# Patient Record
Sex: Male | Born: 1970
Health system: Southern US, Community
[De-identification: ages and names within clinical notes are randomized; demographics above are authoritative.]

## PROBLEM LIST (undated history)

## (undated) DIAGNOSIS — E119 Type 2 diabetes mellitus without complications: Secondary | ICD-10-CM

## (undated) HISTORY — PX: CERVICAL FUSION: SHX112

## (undated) HISTORY — PX: APPENDECTOMY: SHX54

## (undated) HISTORY — PX: EYE SURGERY: SHX253

## (undated) HISTORY — DX: Type 2 diabetes mellitus without complications: E11.9

---

## 2004-03-25 ENCOUNTER — Emergency Department: Payer: Self-pay | Admitting: Internal Medicine

## 2004-10-28 ENCOUNTER — Inpatient Hospital Stay: Payer: Self-pay | Admitting: Surgery

## 2005-01-11 ENCOUNTER — Emergency Department: Payer: Self-pay | Admitting: Emergency Medicine

## 2005-04-19 ENCOUNTER — Emergency Department: Payer: Self-pay | Admitting: Emergency Medicine

## 2006-01-01 ENCOUNTER — Emergency Department: Payer: Self-pay | Admitting: Emergency Medicine

## 2006-03-12 ENCOUNTER — Emergency Department: Payer: Self-pay | Admitting: Emergency Medicine

## 2006-06-24 ENCOUNTER — Emergency Department: Payer: Self-pay | Admitting: Emergency Medicine

## 2008-09-30 ENCOUNTER — Emergency Department: Payer: Self-pay | Admitting: Emergency Medicine

## 2009-11-11 ENCOUNTER — Emergency Department: Payer: Self-pay | Admitting: Unknown Physician Specialty

## 2010-09-30 ENCOUNTER — Emergency Department: Payer: Self-pay | Admitting: Emergency Medicine

## 2012-08-21 ENCOUNTER — Ambulatory Visit: Payer: Self-pay | Admitting: Family Medicine

## 2012-08-24 ENCOUNTER — Ambulatory Visit: Payer: Self-pay | Admitting: Emergency Medicine

## 2012-08-29 ENCOUNTER — Ambulatory Visit: Payer: Self-pay

## 2012-09-04 ENCOUNTER — Encounter: Payer: Self-pay | Admitting: Emergency Medicine

## 2012-09-04 ENCOUNTER — Ambulatory Visit: Payer: Self-pay | Admitting: Family Medicine

## 2012-09-10 ENCOUNTER — Encounter: Payer: Self-pay | Admitting: Emergency Medicine

## 2012-09-12 ENCOUNTER — Ambulatory Visit: Payer: Self-pay

## 2012-10-02 ENCOUNTER — Ambulatory Visit: Payer: Self-pay | Admitting: Family Medicine

## 2012-10-10 ENCOUNTER — Encounter: Payer: Self-pay | Admitting: Emergency Medicine

## 2012-11-10 ENCOUNTER — Encounter: Payer: Self-pay | Admitting: Emergency Medicine

## 2012-12-10 ENCOUNTER — Encounter: Payer: Self-pay | Admitting: Emergency Medicine

## 2013-10-30 ENCOUNTER — Emergency Department: Payer: Self-pay | Admitting: Emergency Medicine

## 2014-01-20 ENCOUNTER — Emergency Department: Payer: Self-pay | Admitting: Internal Medicine

## 2014-03-22 ENCOUNTER — Emergency Department: Payer: Self-pay | Admitting: Emergency Medicine

## 2014-03-22 LAB — BASIC METABOLIC PANEL
Anion Gap: 6 — ABNORMAL LOW (ref 7–16)
BUN: 15 mg/dL (ref 7–18)
CREATININE: 1.18 mg/dL (ref 0.60–1.30)
Calcium, Total: 8.4 mg/dL — ABNORMAL LOW (ref 8.5–10.1)
Chloride: 109 mmol/L — ABNORMAL HIGH (ref 98–107)
Co2: 26 mmol/L (ref 21–32)
EGFR (African American): >60
EGFR (Non-African Amer.): >60
GLUCOSE: 80 mg/dL (ref 65–99)
Osmolality: 281 (ref 275–301)
Potassium: 3.6 mmol/L (ref 3.5–5.1)
Sodium: 141 mmol/L (ref 136–145)

## 2014-03-22 LAB — CBC
HCT: 44.5 % (ref 40.0–52.0)
HGB: 15 g/dL (ref 13.0–18.0)
MCH: 29 pg (ref 26.0–34.0)
MCHC: 33.6 g/dL (ref 32.0–36.0)
MCV: 86 fL (ref 80–100)
PLATELETS: 134 10*3/uL — AB (ref 150–440)
RBC: 5.16 10*6/uL (ref 4.40–5.90)
RDW: 13.2 % (ref 11.5–14.5)
WBC: 11.8 10*3/uL — ABNORMAL HIGH (ref 3.8–10.6)

## 2016-02-06 ENCOUNTER — Ambulatory Visit
Admission: EM | Admit: 2016-02-06 | Discharge: 2016-02-06 | Disposition: A | Discharge status: Home / Self Care | Payer: Medicaid Other | Attending: Family Medicine | Admitting: Family Medicine

## 2016-02-06 ENCOUNTER — Encounter: Payer: Self-pay | Admitting: Emergency Medicine

## 2016-02-06 ENCOUNTER — Emergency Department
Admission: EM | Admit: 2016-02-06 | Discharge: 2016-02-06 | Disposition: A | Discharge status: Home / Self Care | Payer: Medicaid Other | Attending: Emergency Medicine | Admitting: Emergency Medicine

## 2016-02-06 DIAGNOSIS — Y999 Unspecified external cause status: Secondary | ICD-10-CM | POA: Diagnosis not present

## 2016-02-06 DIAGNOSIS — T161XXA Foreign body in right ear, initial encounter: Secondary | ICD-10-CM | POA: Insufficient documentation

## 2016-02-06 DIAGNOSIS — Y939 Activity, unspecified: Secondary | ICD-10-CM | POA: Insufficient documentation

## 2016-02-06 DIAGNOSIS — F17210 Nicotine dependence, cigarettes, uncomplicated: Secondary | ICD-10-CM | POA: Insufficient documentation

## 2016-02-06 DIAGNOSIS — Y929 Unspecified place or not applicable: Secondary | ICD-10-CM | POA: Insufficient documentation

## 2016-02-06 DIAGNOSIS — X58XXXA Exposure to other specified factors, initial encounter: Secondary | ICD-10-CM | POA: Diagnosis not present

## 2016-02-06 MED ORDER — KETOROLAC TROMETHAMINE 30 MG/ML IJ SOLN
30.0000 mg | Freq: Once | INTRAMUSCULAR | Status: AC
  Administered 2016-02-06: 30 mg via INTRAMUSCULAR
  Filled 2016-02-06: qty 1

## 2016-02-06 MED ORDER — HYDROCODONE-ACETAMINOPHEN 5-325 MG PO TABS: 1.0000 | ORAL_TABLET | Freq: Four times a day (QID) | ORAL | 0 refills | Status: DC | PRN

## 2016-02-06 NOTE — Discharge Instructions (Addendum)
Please call Dr. Maisie Fus office in the morning to schedule an appointment for follow up.

## 2016-02-06 NOTE — ED Triage Notes (Signed)
Pt was seen at [facility] urgent care. Had moth fly in right ear last night. They are unable to remove it and sent pt here.

## 2016-02-06 NOTE — ED Notes (Signed)
Pt presents with bug in right ear and reports of difficulty with dizziness and difficulty hearing. Blood seen in right ear but bleeding controlled at time of arrival to treatment room.

## 2016-02-06 NOTE — ED Notes (Signed)
Multiple attempts to remove bug were unsuccessful. Pt reports understanding of follow up.

## 2016-02-06 NOTE — ED Triage Notes (Signed)
Patient was letting his dog out last night when a bug flew into his right ear, and its causing pain and hearing loss. He isn't sure if the bug came out or not.

## 2016-02-06 NOTE — ED Provider Notes (Signed)
[facility]    CSN: [id number] Arrival date & time: 02/06/16  1105  First Provider Contact:  First MD Initiated Contact with Patient 02/06/16 1128        History   Chief Complaint Chief Complaint  Patient presents with  . Hearing Problem    Right Ear    HPI Chris Spence. is a 45 y.o. male.   Patient comes in today because of a foreign object in his right ear. He states he was driving last night felt as if something flew in his right ear he thought it was a month. They use peroxide during the night to try to flush the month out his wife used Q-tips to try to get the month out but was unable to visualize. The pain and discomfort is continued cervix come in to be seen and evaluated here. No pertinent past medical history. No previous surgery history and no pertinent family medical history past relevant to today's visit. He does smoke. No known drug allergies.   The history is provided by the patient. No language interpreter was used.  Otalgia  Location:  Right Behind ear:  No abnormality Quality:  Pressure Severity:  Severe Onset quality:  Sudden Duration:  1 day Timing:  Constant Progression:  Worsening Chronicity:  New Context: foreign body   Context comment:  Patient felt that a month flew in his right ear Relieved by:  Nothing Ineffective treatments:  OTC medications Associated symptoms: headaches   Associated symptoms: no abdominal pain, no congestion, no cough, no diarrhea, no ear discharge, no fever, no hearing loss, no neck pain, no rash, no rhinorrhea, no sore throat, no tinnitus and no vomiting   Risk factors: no recent travel, no chronic ear infection and no prior ear surgery     History reviewed. No pertinent past medical history.  There are no active problems to display for this patient.   History reviewed. No pertinent surgical history.     Home Medications    Prior to Admission medications   Not on File    Family  History History reviewed. No pertinent family history.  Social History Social History  Substance Use Topics  . Smoking status: Current Every Day Smoker    Packs/day: 1.00    Types: Cigarettes  . Smokeless tobacco: Never Used  . Alcohol use Yes     Allergies   Review of patient's allergies indicates no known allergies.   Review of Systems Review of Systems  Constitutional: Negative for fever.  HENT: Positive for ear pain. Negative for congestion, ear discharge, hearing loss, rhinorrhea, sore throat and tinnitus.   Respiratory: Negative for cough.   Gastrointestinal: Negative for abdominal pain, diarrhea and vomiting.  Musculoskeletal: Negative for neck pain.  Skin: Negative for rash.  Neurological: Positive for headaches.  All other systems reviewed and are negative.    Physical Exam Triage Vital Signs ED Triage Vitals  Enc Vitals Group     BP 02/06/16 1124 131/83     Pulse Rate 02/06/16 1124 71     Resp 02/06/16 1124 18     Temp 02/06/16 1124 98.6 F (37 C)     Temp Source 02/06/16 1124 Oral     SpO2 02/06/16 1124 98 %     Weight 02/06/16 1123 199 lb (90.3 kg)     Height 02/06/16 1123 5\' 9"  (1.753 m)     Head Circumference --      Peak Flow --  Pain Score 02/06/16 1124 6     Pain Loc --      Pain Edu? --      Excl. in [place]? --    No data found.   Updated Vital Signs BP 131/83 (BP Location: Right Arm)   Pulse 71   Temp 98.6 F (37 C) (Oral)   Resp 18   Ht 5\' 9"  (1.753 m)   Wt 199 lb (90.3 kg)   SpO2 98%   BMI 29.39 kg/m   Visual Acuity Right Eye Distance:   Left Eye Distance:   Bilateral Distance:    Right Eye Near:   Left Eye Near:    Bilateral Near:     Physical Exam  Constitutional: He is oriented to person, place, and time. He appears well-developed and well-nourished. He appears distressed.  HENT:  Head: Normocephalic.  Right Ear: There is swelling and tenderness. A foreign body is present. Tympanic membrane is erythematous.  Left  Ear: Hearing, tympanic membrane, external ear and ear canal normal.  Nose: Nose normal.  Mouth/Throat: Uvula is midline and oropharynx is clear and moist.  Eyes: Pupils are equal, round, and reactive to light.  Neck: Normal range of motion.  Pulmonary/Chest: Effort normal.  Musculoskeletal: Normal range of motion.  Neurological: He is alert and oriented to person, place, and time.  Skin: Skin is warm.  Psychiatric: His mood appears anxious. He is agitated.     UC Treatments / Results  Labs (all labs ordered are listed, but only abnormal results are displayed) Labs Reviewed - No data to display  EKG  EKG Interpretation None       Radiology No results found.  Procedures .Foreign Body Removal Date/Time: 02/06/2016 12:20 PM Performed by: Frederich Cha Authorized by: Frederich Cha  Consent: Verbal consent obtained. Body area: ear Location details: right ear  Sedation: Patient sedated: no Localization method: ENT speculum and magnification Removal mechanism: alligator forceps Complexity: complex Objects recovered: Bits of the moth Post-procedure assessment: residual foreign bodies remain Comments: Patient ear canal was ready irritated and inflamed from the hot June peroxide used earlier. Month was deep in the ear canal irrigations first tried unsuccessfully. Using an alligator forceps attempts were made to try to remove what appeared to be the month but only bits and pieces could be extracted. Due to patient's poor tolerance of that irrigation was tried a second time which was also unsuccessful and at that time no further attempts were made for manual removal and patient will be sent to [facility] ED for further evaluation. Charge nurse Colletta Maryland notified   (including critical care time)  Medications Ordered in UC Medications - No data to display   Initial Impression / Assessment and Plan / UC Course  I have reviewed the triage vital signs and the nursing notes.  Pertinent  labs & imaging results that were available during my care of the patient were reviewed by me and considered in my medical decision making (see chart for details).  Clinical Course    As above. Patient sent to [facility] for further evaluation and treatment of the foreign object suspected be a month in his right ear canal  Final Clinical Impressions(s) / UC Diagnoses   Final diagnoses:  Acute foreign body of ear canal, right, initial encounter    New Prescriptions There are no discharge medications for this patient.    Frederich Cha, MD 02/06/16 [phone]

## 2016-02-06 NOTE — ED Provider Notes (Signed)
[facility] Emergency Department Provider Note  ____________________________________________  Time seen: Approximately 1:31 PM  I have reviewed the triage vital signs and the nursing notes.   HISTORY  Chief Complaint Foreign Body in Ear    HPI Chris Spence. is a 45 y.o. male , NAD, presents to the emergency department with one-day history of insect in the right ear canal. Patient states a moth flew into his ear last night. The family member attempted to remove some off with out success last evening. He presented to the [facility] in urgent care today again to get the insect removed. Attempts were made at the urgent care but were unable to remove the insect therefore they referred him here. Patient has had increased pain about the right ear canal along with bleeding since the earlier attempt was completed to remove the insect. Notes that his hearing is muffled. Has not had any fevers, chills, body aches. Denies any dizziness, neck pain, headaches. Has had no chest pain or shortness of breath. Has not taken anything at this time for pain.   History reviewed. No pertinent past medical history.  There are no active problems to display for this patient.   History reviewed. No pertinent surgical history.  Prior to Admission medications   Medication Sig Start Date End Date Taking? Authorizing Provider  HYDROcodone-acetaminophen (NORCO) 5-325 MG tablet Take 1 tablet by mouth every 6 (six) hours as needed for severe pain. 02/06/16   Jami L Hagler, PA-C    Allergies Review of patient's allergies indicates no known allergies.  History reviewed. No pertinent family history.  Social History Social History  Substance Use Topics  . Smoking status: Current Every Day Smoker    Packs/day: 1.00    Types: Cigarettes  . Smokeless tobacco: Never Used  . Alcohol use Yes     Review of Systems  Constitutional: No fever/chills ENT: Insect in right ear canal causing  decreased hearing. Pain about the right ear canal with some bleeding. Cardiovascular: No chest pain. Respiratory: No shortness of breath.  Musculoskeletal: Negative for neck pain.  Skin: Negative for rash, redness, swelling. Neurological: Negative for headaches, focal weakness or numbness. No dizziness. 10-point ROS otherwise negative.  ____________________________________________   PHYSICAL EXAM:  VITAL SIGNS: ED Triage Vitals  Enc Vitals Group     BP 02/06/16 1246 (!) 136/91     Pulse Rate 02/06/16 1246 76     Resp 02/06/16 1246 16     Temp 02/06/16 1246 98 F (36.7 C)     Temp Source 02/06/16 1246 Oral     SpO2 02/06/16 1246 98 %     Weight 02/06/16 1244 199 lb (90.3 kg)     Height 02/06/16 1244 5\' 9"  (1.753 m)     Head Circumference --      Peak Flow --      Pain Score --      Pain Loc --      Pain Edu? --      Excl. in [place]? --      Constitutional: Alert and oriented. Well appearing and in no acute distress. Eyes: Conjunctivae are normal without icterus or injection. Head: Atraumatic. ENT:      Ears: Right canal with black foreign body deep in the canal near the eardrum. Some lacerations or bleeding noted throughout the ear canal from previous attempts to remove foreign body. Canal is significantly tender due to the previous attempts to remove the insect.  Nose: No congestion/rhinnorhea.      Mouth/Throat: Mucous membranes are moist.  Neck: Supple with full range of motion. Hematological/Lymphatic/Immunilogical: No cervical lymphadenopathy. Cardiovascular: Good peripheral circulation. Respiratory: Normal respiratory effort without tachypnea or retractions.  Neurologic:  Normal speech and language. No gross focal neurologic deficits are appreciated.  Skin:  Skin is warm, dry and intact. No rash noted. Psychiatric: Mood and affect are normal. Speech and behavior are normal. Patient exhibits appropriate insight and  judgement.   ____________________________________________   LABS  None ____________________________________________  EKG  None ____________________________________________  RADIOLOGY  None ____________________________________________    PROCEDURES  Procedure(s) performed: None   Procedures   Medications  ketorolac (TORADOL) 30 MG/ML injection 30 mg (30 mg Intramuscular Given 02/06/16 1352)     ____________________________________________   INITIAL IMPRESSION / ASSESSMENT AND PLAN / ED COURSE  Pertinent labs & imaging results that were available during my care of the patient were reviewed by me and considered in my medical decision making (see chart for details).  Clinical Course  Comment By Time  Patient states that Toradol did not help with pain and requesting pain medication use at home tonight. Will have him two tablets of Norco to assist with pain control this evening. Braxton Feathers, PA-C 08/27 1420    Patient's diagnosis is consistent with foreign body of right ear canal. Patient given Toradol for pain while in the emergency department and tolerated well without side effects. Patient will be discharged home with a prescription for Norco to take as needed for severe pain as well as instructions to take OTC Tylenol or Motrin as needed. Do not make any further attempts to retrieve insect. Patient is to follow up with Dr. Kathyrn Sheriff in ENT tomorrow for further evaluation and treatment. Patient is given ED precautions to return to the ED for any worsening or new symptoms.    ____________________________________________  FINAL CLINICAL IMPRESSION(S) / ED DIAGNOSES  Final diagnoses:  Acute foreign body of right ear canal, initial encounter      NEW MEDICATIONS STARTED DURING THIS VISIT:  New Prescriptions   HYDROCODONE-ACETAMINOPHEN (NORCO) 5-325 MG TABLET    Take 1 tablet by mouth every 6 (six) hours as needed for severe pain.         Braxton Feathers,  PA-C 02/06/16 [place], MD 02/06/16 1520

## 2016-05-08 ENCOUNTER — Ambulatory Visit
Admission: EM | Admit: 2016-05-08 | Discharge: 2016-05-08 | Disposition: A | Discharge status: Home / Self Care | Payer: Medicaid Other | Attending: Family Medicine | Admitting: Family Medicine

## 2016-05-08 DIAGNOSIS — M62830 Muscle spasm of back: Secondary | ICD-10-CM

## 2016-05-08 DIAGNOSIS — H1031 Unspecified acute conjunctivitis, right eye: Secondary | ICD-10-CM

## 2016-05-08 DIAGNOSIS — M549 Dorsalgia, unspecified: Secondary | ICD-10-CM

## 2016-05-08 MED ORDER — TIZANIDINE HCL 4 MG PO CAPS: 4.0000 mg | ORAL_CAPSULE | Freq: Three times a day (TID) | ORAL | 0 refills | Status: DC | PRN

## 2016-05-08 MED ORDER — MELOXICAM 15 MG PO TABS: 15.0000 mg | ORAL_TABLET | Freq: Every day | ORAL | 0 refills | Status: DC

## 2016-05-08 MED ORDER — GENTAMICIN SULFATE 0.3 % OP SOLN: 2.0000 [drp] | Freq: Three times a day (TID) | OPHTHALMIC | 0 refills | Status: DC

## 2016-05-08 MED ORDER — HYDROCODONE-ACETAMINOPHEN 5-325 MG PO TABS: 1.0000 | ORAL_TABLET | Freq: Three times a day (TID) | ORAL | 0 refills | Status: DC | PRN

## 2016-05-08 MED ORDER — ORPHENADRINE CITRATE ER 100 MG PO TB12: 100.0000 mg | ORAL_TABLET | Freq: Two times a day (BID) | ORAL | 0 refills | Status: DC

## 2016-05-08 NOTE — ED Provider Notes (Signed)
[facility]    CSN: [id number] Arrival date & time: 05/08/16  1416     History   Chief Complaint Chief Complaint  Patient presents with  . Conjunctivitis    Right  . Back Pain    HPI Chris Guanzon. is a 45 y.o. male.   Patient is a 45 year old white male with multiple complaints.   Problem #1 right conjunctivitis. He went to work last night before admission to have a by his right eye being red and inflamed. This morning the eye was matted shut and took a while to get by open. No known drug allergies no foreign object in the eye but he does states it burns.  #2 back pain he has had cervical disc disease requiring surgery and fusion 4. He states his birthday was Saturday and not create out done by his son he try to carry more will that he probably should have. Reports Sunday pain in his left back and the pain has continued and gotten worse. Nothing seems to help.  #3 history of foreign object in right ear month. He shows me pictures of the month it was finally removed by ENT after ER was unable to get the month out.   The history is provided by the patient. No language interpreter was used.  Conjunctivitis  This is a new problem. The current episode started yesterday. The problem occurs constantly. The problem has not changed since onset.Pertinent negatives include no chest pain, no abdominal pain, no headaches and no shortness of breath. Nothing aggravates the symptoms. Nothing relieves the symptoms. He has tried nothing for the symptoms. The treatment provided no relief.  Back Pain  Location:  Thoracic spine and lumbar spine Quality:  Aching Radiates to:  Does not radiate Pain severity:  Moderate Onset quality:  Sudden Progression:  Worsening Chronicity:  New Context: recent illness and recent injury   Relieved by:  Nothing Worsened by:  Movement and stress Ineffective treatments:  NSAIDs and bed rest Associated symptoms: no abdominal pain, no chest pain  and no headaches     History reviewed. No pertinent past medical history.  There are no active problems to display for this patient.   History reviewed. No pertinent surgical history.     Home Medications    Prior to Admission medications   Not on File    Family History History reviewed. No pertinent family history.  Social History Social History  Substance Use Topics  . Smoking status: Current Every Day Smoker    Packs/day: 1.00    Types: Cigarettes  . Smokeless tobacco: Never Used  . Alcohol use Yes     Allergies   Tramadol   Review of Systems Review of Systems  Eyes: Positive for pain, discharge, redness and itching.  Respiratory: Negative for shortness of breath.   Cardiovascular: Negative for chest pain.  Gastrointestinal: Negative for abdominal pain.  Musculoskeletal: Positive for back pain.  Neurological: Negative for headaches.  All other systems reviewed and are negative.    Physical Exam Triage Vital Signs ED Triage Vitals  Enc Vitals Group     BP 05/08/16 1534 (!) 147/87     Pulse Rate 05/08/16 1534 87     Resp 05/08/16 1534 18     Temp 05/08/16 1534 98 F (36.7 C)     Temp Source 05/08/16 1534 Oral     SpO2 05/08/16 1534 100 %     Weight 05/08/16 1535 198 lb (89.8 kg)  Height 05/08/16 1535 5\' 10"  (1.778 m)     Head Circumference --      Peak Flow --      Pain Score 05/08/16 1536 6     Pain Loc --      Pain Edu? --      Excl. in [place]? --    No data found.   Updated Vital Signs BP (!) 147/87 (BP Location: Left Arm)   Pulse 87   Temp 98 F (36.7 C) (Oral)   Resp 18   Ht 5\' 10"  (1.778 m)   Wt 198 lb (89.8 kg)   SpO2 100%   BMI 28.41 kg/m   Visual Acuity Right Eye Distance:   Left Eye Distance:   Bilateral Distance:    Right Eye Near:   Left Eye Near:    Bilateral Near:     Physical Exam  Constitutional: He is oriented to person, place, and time. He appears well-developed and well-nourished.  HENT:  Head:  Normocephalic and atraumatic.  Eyes: EOM are normal. Pupils are equal, round, and reactive to light. Right conjunctiva is injected. Left conjunctiva is not injected.  Neck: Normal range of motion. Neck supple.  Pulmonary/Chest: Breath sounds normal.  Musculoskeletal: He exhibits tenderness. He exhibits no deformity.       Lumbar back: He exhibits decreased range of motion, tenderness, swelling and spasm.       Back:  Neurological: He is alert and oriented to person, place, and time.  Skin: Skin is warm.  Psychiatric: He has a normal mood and affect.  Vitals reviewed.    UC Treatments / Results  Labs (all labs ordered are listed, but only abnormal results are displayed) Labs Reviewed - No data to display  EKG  EKG Interpretation None       Radiology No results found.  Procedures Procedures (including critical care time)  Medications Ordered in UC Medications - No data to display   Initial Impression / Assessment and Plan / UC Course  I have reviewed the triage vital signs and the nursing notes.  Pertinent labs & imaging results that were available during my care of the patient were reviewed by me and considered in my medical decision making (see chart for details).  Clinical Course   Patient will retrieve conjunctivitis with gentamicin eyedrops 2 drops in the right eye 3 times a day. For the back pain without place him on Norflex 100 mg twice a day pharmacy called to let me know that he is on Medicaid that was switched at his to his discharge to Zanaflex instead 4 mg 3 times a day and Mobic 15 mg 1 tablet daily. Patient was also given prescription for Vicodin since he is allergic to tramadol reporting shows the last year and a half in October he was given 2 Vicodin tablets and he was also given prescription of tramadol apparently in September which is where he had the problem with tramadol. Follow-up PCP as needed and work note given to him as well for today and  tomorrow.  Final Clinical Impressions(s) / UC Diagnoses   Final diagnoses:  None    New Prescriptions New Prescriptions   No medications on file     Frederich Cha, MD 05/08/16 1725

## 2016-05-08 NOTE — ED Triage Notes (Signed)
Pt c/o pink eye in his right and drainage. He also pulled his back at a bonfire lifting a large log. The left side of the back.

## 2017-12-28 ENCOUNTER — Ambulatory Visit (INDEPENDENT_AMBULATORY_CARE_PROVIDER_SITE_OTHER): Payer: BLUE CROSS/BLUE SHIELD | Admitting: Family Medicine

## 2017-12-28 ENCOUNTER — Encounter: Payer: Self-pay | Admitting: Family Medicine

## 2017-12-28 VITALS — BP 112/72 | HR 80 | Ht 69.0 in | Wt 194.0 lb

## 2017-12-28 DIAGNOSIS — Z7689 Persons encountering health services in other specified circumstances: Secondary | ICD-10-CM

## 2017-12-28 DIAGNOSIS — D171 Benign lipomatous neoplasm of skin and subcutaneous tissue of trunk: Secondary | ICD-10-CM | POA: Diagnosis not present

## 2017-12-28 DIAGNOSIS — B360 Pityriasis versicolor: Secondary | ICD-10-CM

## 2017-12-28 DIAGNOSIS — L723 Sebaceous cyst: Secondary | ICD-10-CM

## 2017-12-28 NOTE — Patient Instructions (Signed)
Tinea Versicolor Tinea versicolor is a common fungal infection of the skin. It causes a rash that appears as light or dark patches on the skin. The rash most often occurs on the chest, back, neck, or upper arms. This condition is more common during warm weather. Other than affecting how your skin looks, tinea versicolor usually does not cause other problems. In most cases, the infection goes away in a few weeks with treatment. It may take a few months for the patches on your skin to clear up. What are the causes? Tinea versicolor occurs when a type of fungus that is normally present on the skin starts to overgrow. This fungus is a kind of yeast. The exact cause of the overgrowth is not known. This condition cannot be passed from one person to another (noncontagious). What increases the risk? This condition is more likely to develop when certain factors are present, such as:  Heat and humidity.  Sweating too much.  Hormone changes.  Oily skin.  A weak defense (immune) system.  What are the signs or symptoms? Symptoms of this condition may include:  A rash on your skin that is made up of light or dark patches. The rash may have: ? Patches of tan or pink spots on light skin. ? Patches of white or brown spots on dark skin. ? Patches of skin that do not tan. ? Well-marked edges. ? Scales on the discolored areas.  Mild itching.  How is this diagnosed? A health care provider can usually diagnose this condition by looking at your skin. During the exam, he or she may use ultraviolet light to help determine the extent of the infection. In some cases, a skin sample may be taken by scraping the rash. This sample will be viewed under a microscope to check for yeast overgrowth. How is this treated? Treatment for this condition may include:  Dandruff shampoo that is applied to the affected skin during showers or bathing.  Over-the-counter medicated skin cream, lotion, or soaps.  Prescription  antifungal medicine in the form of skin cream or pills.  Medicine to help reduce itching.  Follow these instructions at home:  Take medicines only as directed by your health care provider.  Apply dandruff shampoo to the affected area if told to do so by your health care provider. You may be instructed to scrub the affected skin for several minutes each day.  Do not scratch the affected area of skin.  Avoid hot and humid conditions.  Do not use tanning booths.  Try to avoid sweating a lot. Contact a health care provider if:  Your symptoms get worse.  You have a fever.  You have redness, swelling, or pain at the site of your rash.  You have fluid, blood, or pus coming from your rash.  Your rash returns after treatment. This information is not intended to replace advice given to you by your health care provider. Make sure you discuss any questions you have with your health care provider. Document Released: 05/26/2000 Document Revised: 01/30/2016 Document Reviewed: 03/10/2014 Elsevier Interactive Patient Education  2018 [employer]  

## 2017-12-28 NOTE — Progress Notes (Signed)
Name: Chris Spence.   MRN: [id number]    DOB: Nov 07, 1970   Date:12/28/2017       Progress Note  Subjective  Chief Complaint  Chief Complaint  Patient presents with  . Establish Care  . Cyst    "have a lump on back and Left armpit"- the one on back- been there for years. The armpit- been there fore almost a year/ gradually getting larger, tender and harder    Patient presents to establish care with primary care physicians. Patient with difficulty sleeping on back due to pain developing in area of lipoma.  Rash  This is a chronic problem. The current episode started more than 1 year ago. The problem has been waxing and waning since onset. The rash is diffuse. Pertinent negatives include no cough, diarrhea, fever, joint pain, shortness of breath or sore throat.    No problem-specific Assessment & Plan notes found for this encounter.   History reviewed. No pertinent past medical history.  Past Surgical History:  Procedure Laterality Date  . APPENDECTOMY    . CERVICAL FUSION     C-6 and C-7  . EYE SURGERY     R) eye- "growth removed"  L) eye- removed foreign object    Family History  Problem Relation Age of Onset  . Cancer Mother   . Cancer Sister   . Diabetes Maternal Grandmother     Social History   Socioeconomic History  . Marital status: Married    Spouse name: Not on file  . Number of children: Not on file  . Years of education: Not on file  . Highest education level: Not on file  Occupational History  . Not on file  Social Needs  . Financial resource strain: Not on file  . Food insecurity:    Worry: Not on file    Inability: Not on file  . Transportation needs:    Medical: Not on file    Non-medical: Not on file  Tobacco Use  . Smoking status: Current Every Day Smoker    Packs/day: 1.00    Types: Cigarettes  . Smokeless tobacco: Never Used  . Tobacco comment: patches and pills discussed  Substance and Sexual Activity  . Alcohol use: Yes  . Drug  use: No  . Sexual activity: Yes  Lifestyle  . Physical activity:    Days per week: Not on file    Minutes per session: Not on file  . Stress: Not on file  Relationships  . Social connections:    Talks on phone: Not on file    Gets together: Not on file    Attends religious service: Not on file    Active member of club or organization: Not on file    Attends meetings of clubs or organizations: Not on file    Relationship status: Not on file  . Intimate partner violence:    Fear of current or ex partner: Not on file    Emotionally abused: Not on file    Physically abused: Not on file    Forced sexual activity: Not on file  Other Topics Concern  . Not on file  Social History Narrative  . Not on file    Allergies  Allergen Reactions  . Tramadol Nausea And Vomiting    Outpatient Medications Prior to Visit  Medication Sig Dispense Refill  . Multiple Vitamins-Minerals (ONE-A-DAY MENS HEALTH FORMULA PO) Take 1 tablet by mouth daily.    Marland Kitchen gentamicin (GARAMYCIN) 0.3 % ophthalmic  solution Place 2 drops into the right eye 3 (three) times daily. For 3-5 days 5 mL 0  . HYDROcodone-acetaminophen (NORCO) 5-325 MG tablet Take 1 tablet by mouth every 8 (eight) hours as needed for moderate pain. 15 tablet 0  . meloxicam (MOBIC) 15 MG tablet Take 1 tablet (15 mg total) by mouth daily. 30 tablet 0  . tiZANidine (ZANAFLEX) 4 MG capsule Take 1 capsule (4 mg total) by mouth 3 (three) times daily as needed for muscle spasms. 60 capsule 0   No facility-administered medications prior to visit.     Review of Systems  Constitutional: Negative for chills, fever, malaise/fatigue and weight loss.  HENT: Negative for ear discharge, ear pain and sore throat.   Eyes: Negative for blurred vision.  Respiratory: Negative for cough, sputum production, shortness of breath and wheezing.   Cardiovascular: Negative for chest pain, palpitations and leg swelling.  Gastrointestinal: Negative for abdominal pain,  blood in stool, constipation, diarrhea, heartburn, melena and nausea.  Genitourinary: Negative for dysuria, frequency, hematuria and urgency.  Musculoskeletal: Negative for back pain, joint pain, myalgias and neck pain.  Skin: Positive for rash.  Neurological: Negative for dizziness, tingling, sensory change, focal weakness and headaches.  Endo/Heme/Allergies: Negative for environmental allergies and polydipsia. Does not bruise/bleed easily.  Psychiatric/Behavioral: Negative for depression and suicidal ideas. The patient is not nervous/anxious and does not have insomnia.      Objective  Vitals:   12/28/17 1429  BP: 112/72  Pulse: 80  Weight: 194 lb (88 kg)  Height: 5\' 9"  (1.753 m)    Physical Exam  Constitutional: He is oriented to person, place, and time.  HENT:  Head: Normocephalic.  Right Ear: External ear normal.  Left Ear: External ear normal.  Nose: Nose normal.  Mouth/Throat: Oropharynx is clear and moist.  Eyes: Pupils are equal, round, and reactive to light. Conjunctivae and EOM are normal. Right eye exhibits no discharge. Left eye exhibits no discharge. No scleral icterus.  Neck: Normal range of motion. Neck supple. No JVD present. No tracheal deviation present. No thyromegaly present.  Cardiovascular: Normal rate, regular rhythm, normal heart sounds and intact distal pulses. Exam reveals no gallop and no friction rub.  No murmur heard. Pulmonary/Chest: Breath sounds normal. No respiratory distress. He has no wheezes. He has no rales.  Abdominal: Soft. Bowel sounds are normal. He exhibits no mass. There is no hepatosplenomegaly. There is no tenderness. There is no rebound, no guarding and no CVA tenderness.  Musculoskeletal: Normal range of motion. He exhibits no edema or tenderness.  Lymphadenopathy:    He has no cervical adenopathy.  Neurological: He is alert and oriented to person, place, and time. He has normal strength and normal reflexes.  Skin: Skin is warm. No  rash noted.  1)Sebaceous cyst left axillary /no tenderness/erythema 3x2 cm firm  2) palpable lipom right suprascapular area   Nursing note and vitals reviewed.     Assessment & Plan  Problem List Items Addressed This Visit    None    Visit Diagnoses    Establishing care with new doctor, encounter for    -  Primary   Patient establish for future care.  Needs surgical referral for lipoma and cyst.   Tinea versicolor       chronic recurrent generalized. will trial nizoral shampoo soln.   Lipoma of back       Pain in cervical area while sleep on back in area of lipoma   Relevant Orders  Ambulatory referral to General Surgery   Sebaceous cyst of left axilla       Area gradually enlarging becoming more tender   Relevant Orders   Ambulatory referral to General Surgery      No orders of the defined types were placed in this encounter.     Dr. Mac[facility]  12/28/17

## 2018-01-04 ENCOUNTER — Other Ambulatory Visit: Payer: Self-pay

## 2018-01-07 ENCOUNTER — Ambulatory Visit: Payer: Self-pay | Admitting: Surgery

## 2018-01-09 ENCOUNTER — Ambulatory Visit (INDEPENDENT_AMBULATORY_CARE_PROVIDER_SITE_OTHER): Payer: BLUE CROSS/BLUE SHIELD | Admitting: Surgery

## 2018-01-09 ENCOUNTER — Encounter: Payer: Self-pay | Admitting: Surgery

## 2018-01-09 VITALS — BP 120/88 | HR 77 | Temp 97.5°F | Ht 70.0 in | Wt 197.0 lb

## 2018-01-09 DIAGNOSIS — L729 Follicular cyst of the skin and subcutaneous tissue, unspecified: Secondary | ICD-10-CM | POA: Diagnosis not present

## 2018-01-09 NOTE — Patient Instructions (Signed)
Lipoma A lipoma is a noncancerous (benign) tumor that is made up of fat cells. This is a very common type of soft-tissue growth. Lipomas are usually found under the skin (subcutaneous). They may occur in any tissue of the body that contains fat. Common areas for lipomas to appear include the back, shoulders, buttocks, and thighs. Lipomas grow slowly, and they are usually painless. Most lipomas do not cause problems and do not require treatment. What are the causes? The cause of this condition is not known. What increases the risk? This condition is more likely to develop in:  People who are 40-60 years old.  People who have a family history of lipomas.  What are the signs or symptoms? A lipoma usually appears as a small, round bump under the skin. It may feel soft or rubbery, but the firmness can vary. Most lipomas are not painful. However, a lipoma may become painful if it is located in an area where it pushes on nerves. How is this diagnosed? A lipoma can usually be diagnosed with a physical exam. You may also have tests to confirm the diagnosis and to rule out other conditions. Tests may include:  Imaging tests, such as a CT scan or MRI.  Removal of a tissue sample to be looked at under a microscope (biopsy).  How is this treated? Treatment is not needed for small lipomas that are not causing problems. If a lipoma continues to get bigger or it causes problems, removal is often the best option. Lipomas can also be removed to improve appearance. Removal of a lipoma is usually done with a surgery in which the fatty cells and the surrounding capsule are removed. Most often, a medicine that numbs the area (local anesthetic) is used for this procedure. Follow these instructions at home:  Keep all follow-up visits as directed by your health care provider. This is important. Contact a health care provider if:  Your lipoma becomes larger or hard.  Your lipoma becomes painful, red, or  increasingly swollen. These could be signs of infection or a more serious condition. This information is not intended to replace advice given to you by your health care provider. Make sure you discuss any questions you have with your health care provider. Document Released: 05/19/2002 Document Revised: 11/04/2015 Document Reviewed: 05/25/2014 Elsevier Interactive Patient Education  2018 [employer] Inc[employer]  

## 2018-01-09 NOTE — Progress Notes (Signed)
Patient ID: Chris Danas., male   DOB: April 25, 1971, 47 y.o.   MRN: [id number]  HPI Chris Hurta. is a 47 y.o. male seen at the request of [name] for symptomatic subcutaneous nodules one in the axial and the other one posterior neck.  He reports that he has had those for about 2 years but recently the left axillary nodule has increased in size.  Also reports that it is tender and experiences some intermittent sharp mild to moderate pain.  There is no specific alleviating or aggravating factors.  No weight loss no night chills no B type symptoms. He is able to perform more than 6 METS of activity without any shortness of breath or chest pain.  HPI  No relevant medical history other than a history of melanoma  Past Surgical History:  Procedure Laterality Date  . APPENDECTOMY    . CERVICAL FUSION     C-6 and C-7  . EYE SURGERY     R) eye- "growth removed"  L) eye- removed foreign object    Family History  Problem Relation Age of Onset  . Cancer Mother   . Cancer Sister   . Diabetes Maternal Grandmother     Social History Social History   Tobacco Use  . Smoking status: Current Every Day Smoker    Packs/day: 1.00    Types: Cigarettes  . Smokeless tobacco: Never Used  . Tobacco comment: patches and pills discussed  Substance Use Topics  . Alcohol use: Yes  . Drug use: No    Allergies  Allergen Reactions  . Tramadol Nausea And Vomiting    Current Outpatient Medications  Medication Sig Dispense Refill  . Emollient (EUCERIN) lotion Apply topically.    . Multiple Vitamins-Minerals (ONE-A-DAY MENS HEALTH FORMULA PO) Take 1 tablet by mouth daily.    . Sunscreens (TOTAL BLOCK SPF 60 COVER UP) LOTN Apply topically.     No current facility-administered medications for this visit.      Review of Systems Full ROS  was asked and was negative except for the information on the HPI  Physical Exam Blood pressure 120/88, pulse 77, temperature (!) 97.5 F (36.4 C),  temperature source Oral, height 5\' 10"  (1.778 m), weight 197 lb (89.4 kg). CONSTITUTIONAL: NAD EYES: Pupils are equal, round, and , Sclera are non-icteric. EARS, NOSE, MOUTH AND THROAT: The oropharynx is clear. The oral mucosa is pink and moist. Hearing is intact to voice. LYMPH NODES:  Lymph nodes in the neck are normal. RESPIRATORY:  Lungs are clear. There is normal respiratory effort, with equal breath sounds bilaterally, and without pathologic use of accessory muscles. CARDIOVASCULAR: Heart is regular without murmurs, gallops, or rubs. GI: The abdomen is soft, nontender, and nondistended. There are no palpable masses. There is no hepatosplenomegaly. There are normal bowel sounds in all quadrants. GU: Rectal deferred.   MUSCULOSKELETAL: Normal muscle strength and tone. No cyanosis or edema.   SKIN:  There is a 3 cm subq nodule on left axilla, it is tender. There is a 4 cm sub q nodule posterior neck. [place]. NEUROLOGIC: Motor and sensation is grossly normal. Cranial nerves are grossly intact. PSYCH:  Oriented to person, place and time. Affect is normal.  Data Reviewed  I have personally reviewed the patient's imaging, laboratory findings and medical records.    Assessment/Plan 47 year old male with a prior history of melanoma as a child now presents with 2 subcutaneous nodules one in the left axilla and another one on  the posterior neck.  They are symptomatic.  Discussed with the patient in detail about my recommendation for excisional biopsy.  We can definitely perform an in the office under local anesthetic.  We will definitely send it for pathology to make sure there is no potential metastasis from remote history of melanoma on his back.  Procedure discussed with the patient detail.  Risk benefit and possible complications including but not limited to: Bleeding, infection, recurrence, pathology being cancer.  He understands and wishes to proceed. A Copy of this report will be sent to the  referring provider   Caroleen Hamman, MD FACS General Surgeon 01/09/2018, 9:25 PM

## 2018-01-14 ENCOUNTER — Other Ambulatory Visit: Payer: Self-pay | Admitting: Surgery

## 2018-01-14 ENCOUNTER — Ambulatory Visit (INDEPENDENT_AMBULATORY_CARE_PROVIDER_SITE_OTHER): Payer: BLUE CROSS/BLUE SHIELD | Admitting: Surgery

## 2018-01-14 ENCOUNTER — Telehealth: Payer: Self-pay

## 2018-01-14 ENCOUNTER — Encounter: Payer: Self-pay | Admitting: Surgery

## 2018-01-14 VITALS — BP 148/91 | HR 80 | Ht 72.0 in | Wt 200.0 lb

## 2018-01-14 DIAGNOSIS — D210 Benign neoplasm of connective and other soft tissue of head, face and neck: Secondary | ICD-10-CM | POA: Diagnosis not present

## 2018-01-14 DIAGNOSIS — D2112 Benign neoplasm of connective and other soft tissue of left upper limb, including shoulder: Secondary | ICD-10-CM | POA: Diagnosis not present

## 2018-01-14 DIAGNOSIS — L728 Other follicular cysts of the skin and subcutaneous tissue: Secondary | ICD-10-CM | POA: Diagnosis not present

## 2018-01-14 DIAGNOSIS — D170 Benign lipomatous neoplasm of skin and subcutaneous tissue of head, face and neck: Secondary | ICD-10-CM | POA: Diagnosis not present

## 2018-01-14 DIAGNOSIS — L729 Follicular cyst of the skin and subcutaneous tissue, unspecified: Secondary | ICD-10-CM

## 2018-01-14 DIAGNOSIS — D179 Benign lipomatous neoplasm, unspecified: Secondary | ICD-10-CM | POA: Diagnosis not present

## 2018-01-14 NOTE — Patient Instructions (Signed)
.We have removed a Cyst in our office today.  You have sutures under the skin that will dissolve and also dermabond (skin glue) on top of your skin which will come off on it's own in 10-14 days.  You may shower in 48 hours, this is on 01/16/2018.  Avoid Strenuous activities that will make you sweat during the next 48 hours to avoid the glue coming off prematurely. Avoid activities that will place pressure to this area of the body for 1-2 weeks to avoid re-injury to incision site.  Please see your follow-up appointment provided. We will see you back in office to make sure this area is healed and to review the final pathology. If you have any questions or concerns prior to this appointment, call our office and speak with a nurse.    Excision of Skin Cysts or Lesions Excision of a skin lesion refers to the removal of a section of skin by making small cuts (incisions) in the skin. This procedure may be done to remove a cancerous (malignant) or noncancerous (benign) growth on the skin. It is typically done to treat or prevent cancer or infection. It may also be done to improve cosmetic appearance. The procedure may be done to remove:  Cancerous growths, such as basal cell carcinoma, squamous cell carcinoma, or melanoma.  Noncancerous growths, such as a cyst or lipoma.  Growths, such as moles or skin tags, which may be removed for cosmetic reasons.  Various excision or surgical techniques may be used depending on your condition, the location of the lesion, and your overall health. Tell a health care provider about:  Any allergies you have.  All medicines you are taking, including vitamins, herbs, eye drops, creams, and over-the-counter medicines.  Any problems you or family members have had with anesthetic medicines.  Any blood disorders you have.  Any surgeries you have had.  Any medical conditions you have.  Whether you are pregnant or may be pregnant. What are the risks? Generally,  this is a safe procedure. However, problems may occur, including:  Bleeding.  Infection.  Scarring.  Recurrence of the cyst, lipoma, or cancer.  Changes in skin sensation or appearance, such as discoloration or swelling.  Reaction to the anesthetics.  Allergic reaction to surgical materials or ointments.  Damage to nerves, blood vessels, muscles, or other structures.  Continued pain.  What happens before the procedure?  Ask your health care provider about: ? Changing or stopping your regular medicines. This is especially important if you are taking diabetes medicines or blood thinners. ? Taking medicines such as aspirin and ibuprofen. These medicines can thin your blood. Do not take these medicines before your procedure if your health care provider instructs you not to.  You may be asked to take certain medicines.  You may be asked to stop smoking.  You may have an exam or testing.  Plan to have someone take you home after the procedure.  Plan to have someone help you with activities during recovery. What happens during the procedure?  To reduce your risk of infection: ? Your health care team will wash or sanitize their hands. ? Your skin will be washed with soap.  You will be given a medicine to numb the area (local anesthetic).  One of the following excision techniques will be performed.  At the end of any of these procedures, antibiotic ointment will be applied as needed. Each of the following techniques may vary among health care providers and hospitals. Complete  Surgical Excision The area of skin that needs to be removed will be marked with a pen. Using a small scalpel or scissors, the surgeon will gently cut around and under the lesion until it is completely removed. The lesion will be placed in a fluid and sent to the lab for examination. If necessary, bleeding will be controlled with a device that delivers heat (electrocautery). The edges of the wound may be  stitched (sutured) together, and a bandage (dressing) will be applied. This procedure may be performed to treat a cancerous growth or a noncancerous cyst or lesion. Excision of a Cyst The surgeon will make an incision on the cyst. The entire cyst will be removed through the incision. The incision may be closed with sutures. Shave Excision During shave excision, the surgeon will use a small blade or an electrically heated loop instrument to shave off the lesion. This may be done to remove a mole or a skin tag. The wound will usually be left to heal on its own without sutures. Punch Excision During punch excision, the surgeon will use a small tool that is like a cookie cutter or a hole punch to cut a circle shape out of the skin. The outer edges of the skin will be sutured together. This may be done to remove a mole or a scar or to perform a biopsy of the lesion. Mohs Micrographic Surgery During Mohs micrographic surgery, layers of the lesion will be removed with a scalpel or a loop instrument and will be examined right away under a microscope. Layers will be removed until all of the abnormal or cancerous tissue has been removed. This procedure is minimally invasive, and it ensures the best cosmetic outcome. It involves the removal of as little normal tissue as possible. Mohs is usually done to treat skin cancer, such as basal cell carcinoma or squamous cell carcinoma, particularly on the face and ears. Depending on the size of the surgical wound, it may be sutured closed. What happens after the procedure?  Return to your normal activities as told by your health care provider.  Talk with your health care provider to discuss any test results, treatment options, and if necessary, the need for more tests. This information is not intended to replace advice given to you by your health care provider. Make sure you discuss any questions you have with your health care provider. Document Released: 08/23/2009  Document Revised: 11/04/2015 Document Reviewed: 07/15/2014 Elsevier Interactive Patient Education  [employer].

## 2018-01-14 NOTE — Progress Notes (Signed)
Procedure Note  Diagnosis: Posterior deep neck mass Left  Axillary mass  Procedures: 1. Excision Deep posterior Neck mass 3.5 cm  2. Intermediate closure 3.5 cm wound post neck 3. Excision of Left axillary mass 31 mm  4. Intermediate closure 31 mm wound Left  Axilla  Anesthesia: lidocaine 1% w epi  EBL: 5cc  Complications: None  The patient was explained about the risk benefits and possible complications including but not limited to: Bleeding, pain, infection and re-interventions.  A consent was signed.  He was placed in a sitting up position and his posterior neck was prepped and draped in the usual sterile fashion.  Lidocaine 1% with epinephrine was infiltrated and using a 15 blade knife created incision.  Please note that this was a deep posterior neck mass were able to incise the superficial fascia with Metzenbaum scissors and using Metzenbaums we were able to dissect the mass from adjacent structure.  Hemostasis obtained with pressure.  The mass was excised and sent for permanent pathology.  It was a hard mass questionable metastatic melanoma node.  The wound was closed in a 2 layer fashion with a Monocryl for the skin.  Attention then was turned to the axilla where the cystic lesion was visualized and lidocaine was injected.  Incision was created and the cyst was superficial and was dissected free from adjacent structures using Metzenbaum scissors.  Hemostasis was obtained with pressure.  The specimen was sent it was consistent with epidermal inclusion cyst.  The wound was closed in 2 layers using 3-0 Vicryl for the dermis and 4-0 Monocryl for the skin in a subicular fashion.  Dermabond was used to coat both incisions

## 2018-01-14 NOTE — Telephone Encounter (Signed)
Call placed to [facility] at this time for specimen pick up> Spoke with Baxter Flattery . And she connected me to VM.  Specimen placed in white drop box and hung over door.   Left message on VM for specimen pick up.

## 2018-01-18 LAB — PATHOLOGY

## 2018-01-21 ENCOUNTER — Telehealth: Payer: Self-pay

## 2018-01-21 NOTE — Telephone Encounter (Signed)
Patient notified of pathology results and reminded of follow up appointment 01/28/18 @ 10:15 am new building.

## 2018-01-28 ENCOUNTER — Encounter: Payer: Self-pay | Admitting: Surgery

## 2018-04-25 ENCOUNTER — Encounter: Payer: Self-pay | Admitting: *Deleted

## 2019-04-10 DIAGNOSIS — R05 Cough: Secondary | ICD-10-CM | POA: Diagnosis not present

## 2019-04-10 DIAGNOSIS — R062 Wheezing: Secondary | ICD-10-CM | POA: Diagnosis not present

## 2019-04-10 DIAGNOSIS — R0981 Nasal congestion: Secondary | ICD-10-CM | POA: Diagnosis not present

## 2019-04-10 DIAGNOSIS — J209 Acute bronchitis, unspecified: Secondary | ICD-10-CM | POA: Diagnosis not present

## 2019-04-10 DIAGNOSIS — R0602 Shortness of breath: Secondary | ICD-10-CM | POA: Diagnosis not present

## 2019-06-05 DIAGNOSIS — Z20828 Contact with and (suspected) exposure to other viral communicable diseases: Secondary | ICD-10-CM | POA: Diagnosis not present

## 2019-06-05 DIAGNOSIS — R05 Cough: Secondary | ICD-10-CM | POA: Diagnosis not present

## 2019-11-20 DIAGNOSIS — S46822A Laceration of other muscles, fascia and tendons at shoulder and upper arm level, left arm, initial encounter: Secondary | ICD-10-CM | POA: Diagnosis not present

## 2019-11-27 DIAGNOSIS — M7712 Lateral epicondylitis, left elbow: Secondary | ICD-10-CM | POA: Diagnosis not present

## 2019-12-12 DIAGNOSIS — J040 Acute laryngitis: Secondary | ICD-10-CM | POA: Diagnosis not present

## 2019-12-12 DIAGNOSIS — Z7251 High risk heterosexual behavior: Secondary | ICD-10-CM | POA: Diagnosis not present

## 2020-03-01 DIAGNOSIS — J019 Acute sinusitis, unspecified: Secondary | ICD-10-CM | POA: Diagnosis not present

## 2020-03-01 DIAGNOSIS — Z03818 Encounter for observation for suspected exposure to other biological agents ruled out: Secondary | ICD-10-CM | POA: Diagnosis not present

## 2020-05-25 DIAGNOSIS — Z20822 Contact with and (suspected) exposure to covid-19: Secondary | ICD-10-CM | POA: Diagnosis not present

## 2020-05-25 DIAGNOSIS — U071 COVID-19: Secondary | ICD-10-CM | POA: Diagnosis not present

## 2020-05-27 DIAGNOSIS — Z20822 Contact with and (suspected) exposure to covid-19: Secondary | ICD-10-CM | POA: Diagnosis not present

## 2020-10-22 ENCOUNTER — Emergency Department
Admission: EM | Admit: 2020-10-22 | Discharge: 2020-10-22 | Disposition: A | Discharge status: Home / Self Care | Payer: BC Managed Care – PPO | Attending: Student in an Organized Health Care Education/Training Program | Admitting: Student in an Organized Health Care Education/Training Program

## 2020-10-22 ENCOUNTER — Other Ambulatory Visit: Payer: Self-pay

## 2020-10-22 DIAGNOSIS — H9202 Otalgia, left ear: Secondary | ICD-10-CM | POA: Diagnosis not present

## 2020-10-22 DIAGNOSIS — F17210 Nicotine dependence, cigarettes, uncomplicated: Secondary | ICD-10-CM | POA: Diagnosis not present

## 2020-10-22 DIAGNOSIS — H6092 Unspecified otitis externa, left ear: Secondary | ICD-10-CM | POA: Diagnosis not present

## 2020-10-22 LAB — CBC WITH DIFFERENTIAL/PLATELET
Abs Immature Granulocytes: 0.02 10*3/uL (ref 0.00–0.07)
Basophils Absolute: 0 10*3/uL (ref 0.0–0.1)
Basophils Relative: 1 %
Eosinophils Absolute: 0.1 10*3/uL (ref 0.0–0.5)
Eosinophils Relative: 2 %
HCT: 47.1 % (ref 39.0–52.0)
Hemoglobin: 16.4 g/dL (ref 13.0–17.0)
Immature Granulocytes: 0 %
Lymphocytes Relative: 37 %
Lymphs Abs: 2.6 10*3/uL (ref 0.7–4.0)
MCH: 28.7 pg (ref 26.0–34.0)
MCHC: 34.8 g/dL (ref 30.0–36.0)
MCV: 82.5 fL (ref 80.0–100.0)
Monocytes Absolute: 0.5 10*3/uL (ref 0.1–1.0)
Monocytes Relative: 8 %
Neutro Abs: 3.6 10*3/uL (ref 1.7–7.7)
Neutrophils Relative %: 52 %
Platelets: 185 10*3/uL (ref 150–400)
RBC: 5.71 MIL/uL (ref 4.22–5.81)
RDW: 12.8 % (ref 11.5–15.5)
WBC: 6.9 10*3/uL (ref 4.0–10.5)
nRBC: 0 % (ref 0.0–0.2)

## 2020-10-22 LAB — BASIC METABOLIC PANEL
Anion gap: 7 (ref 5–15)
BUN: 13 mg/dL (ref 6–20)
CO2: 23 mmol/L (ref 22–32)
Calcium: 8.7 mg/dL — ABNORMAL LOW (ref 8.9–10.3)
Chloride: 106 mmol/L (ref 98–111)
Creatinine, Ser: 0.83 mg/dL (ref 0.61–1.24)
GFR, Estimated: >60 mL/min (ref >60)
Glucose, Bld: 91 mg/dL (ref 70–99)
Potassium: 4.1 mmol/L (ref 3.5–5.1)
Sodium: 136 mmol/L (ref 135–145)

## 2020-10-22 LAB — TROPONIN I (HIGH SENSITIVITY): Troponin I (High Sensitivity): <2 ng/L (ref <18)

## 2020-10-22 MED ORDER — NEOMYCIN-POLYMYXIN-HC 3.5-10000-1 OT SOLN: 3.0000 [drp] | Freq: Three times a day (TID) | OTIC | 0 refills | Status: AC

## 2020-10-22 MED ORDER — NAPROXEN 500 MG PO TABS: 500.0000 mg | ORAL_TABLET | Freq: Two times a day (BID) | ORAL | 0 refills | Status: DC

## 2020-10-22 NOTE — ED Notes (Signed)
See triage note  Presents with some pain to left jaw area  States jaw pain has been intermittent   But noticed pain moving into left ear after biting on something couple of days ago

## 2020-10-22 NOTE — Discharge Instructions (Signed)
Follow-up with [facility] acute care or [facility] if any continued problems with your left ear.  [name] is on-call for [facility] and his contact information and phone number is listed on your discharge papers.  Begin taking naproxen 500 mg twice daily with food.  Also prescription for Cortisporin otic suspension 3 drops to your left ear 3 times a day.  Avoid using your ear plugs this weekend.

## 2020-10-22 NOTE — ED Triage Notes (Signed)
Pt reports that he has been having intermittent left jaw pain with chewing for the past couple weeks, states the other night he bit something and the pain was so severe it made his vision blurred in his left eye, denies hx of TMJ diagnosis

## 2020-10-22 NOTE — ED Provider Notes (Signed)
[facility] Emergency Department Provider Note  ____________________________________________   Event Date/Time   First MD Initiated Contact with Patient 10/22/20 1317     (approximate)  I have reviewed the triage vital signs and the nursing notes.   HISTORY  Chief Complaint Jaw Pain   HPI Chris Spence. is a 50 y.o. male presents to the ED with complaint of left ear pain with some left posterior auricular pain that increases when he is chewing food.  Patient states that this is been going on for 2 to 3 weeks.  He denies any injury to his left mandible and no history of TMJ in the past.  Patient has not taken any over-the-counter medication for this.  Patient also wears earplugs at work which frequently irritates his ears.  He denies any fever, chills, upper respiratory symptoms.  No history of cardiac issues.     No past medical history on file.  There are no problems to display for this patient.   Past Surgical History:  Procedure Laterality Date  . APPENDECTOMY    . CERVICAL FUSION     C-6 and C-7  . EYE SURGERY     R) eye- "growth removed"  L) eye- removed foreign object    Prior to Admission medications   Medication Sig Start Date End Date Taking? Authorizing Provider  naproxen (NAPROSYN) 500 MG tablet Take 1 tablet (500 mg total) by mouth 2 (two) times daily with a meal. 10/22/20  Yes Letitia Neri L, PA-C  neomycin-polymyxin-hydrocortisone (CORTISPORIN) OTIC solution Place 3 drops into the left ear 3 (three) times daily for 10 days. 10/22/20 11/01/20 Yes Saqib Cazarez L, PA-C  Emollient (EUCERIN) lotion Apply topically. 06/24/14   [provider]  Multiple Vitamins-Minerals (ONE-A-DAY MENS HEALTH FORMULA PO) Take 1 tablet by mouth daily.    [provider]  Sunscreens (TOTAL BLOCK SPF 60 COVER UP) LOTN Apply topically. 06/24/14   [provider]    Allergies Tramadol  Family History  Problem Relation Age of  Onset  . Cancer Mother   . Cancer Sister   . Diabetes Maternal Grandmother     Social History Social History   Tobacco Use  . Smoking status: Current Every Day Smoker    Packs/day: 1.00    Types: Cigarettes  . Smokeless tobacco: Never Used  . Tobacco comment: patches and pills discussed  Substance Use Topics  . Alcohol use: Yes  . Drug use: No    Review of Systems Constitutional: No fever/chills Eyes: No visual changes. ENT: No sore throat.  Positive left ear pain. Cardiovascular: Denies chest pain. Respiratory: Denies shortness of breath. Gastrointestinal: No abdominal pain.  No nausea, no vomiting.  No diarrhea.  No constipation. Musculoskeletal: Negative for muscle skeletal pain. Skin: Negative for rash. Neurological: Negative for headaches, focal weakness or numbness. ____________________________________________   PHYSICAL EXAM:  VITAL SIGNS: ED Triage Vitals  Enc Vitals Group     BP 10/22/20 1304 (!) 143/77     Pulse Rate 10/22/20 1304 73     Resp 10/22/20 1304 16     Temp 10/22/20 1304 97.7 F (36.5 C)     Temp Source 10/22/20 1304 Oral     SpO2 10/22/20 1304 98 %     Weight 10/22/20 1304 200 lb (90.7 kg)     Height 10/22/20 1304 5\' 9"  (1.753 m)     Head Circumference --      Peak Flow --  Pain Score 10/22/20 1305 5     Pain Loc --      Pain Edu? --      Excl. in [place]? --     Constitutional: Alert and oriented. Well appearing and in no acute distress. Eyes: Conjunctivae are normal. PERRL. EOMI. Head: Atraumatic. Nose: No congestion/rhinnorhea. Ears: Right EAC and TM are clear.  Left EAC has minimal amount of cerumen present.  Canal is erythematous but no exudate is present.  TM is dull but no erythema or injection is noted. Mouth/Throat: Mucous membranes are moist.  Oropharynx non-erythematous.  No edema noted to the gums in the left upper area to suggest other explanations for his pain. Neck: No stridor.  No cervical lymphadenopathy  noted. Cardiovascular: Normal rate, regular rhythm. Grossly normal heart sounds.  Good peripheral circulation. Respiratory: Normal respiratory effort.  No retractions. Lungs CTAB. Gastrointestinal: Soft and nontender. No distention. No abdominal bruits. No CVA tenderness. Neurologic:  Normal speech and language. No gross focal neurologic deficits are appreciated. No gait instability. Skin:  Skin is warm, dry and intact. No rash noted. Psychiatric: Mood and affect are normal. Speech and behavior are normal.  ____________________________________________   LABS (all labs ordered are listed, but only abnormal results are displayed)  Labs Reviewed  BASIC METABOLIC PANEL - Abnormal; Notable for the following components:      Result Value   Calcium 8.7 (*)    All other components within normal limits  CBC WITH DIFFERENTIAL/PLATELET  TROPONIN I (HIGH SENSITIVITY)    PROCEDURES  Procedure(s) performed (including Critical Care):  Procedures   ____________________________________________   INITIAL IMPRESSION / ASSESSMENT AND PLAN / ED COURSE  As part of my medical decision making, I reviewed the following data within the electronic MEDICAL RECORD NUMBER Notes from prior ED visits and [place] Controlled Substance Database  50 year old male presents to the ED with complaint of left ear pain for 2 to 3 weeks.  Patient states he has noticed that he has increased pain especially when he is chewing in the last couple weeks.  He denies any injury to his mandible and no teeth are hurting at this time.  He does use earplugs on a daily basis because of work.  Patient denies any URI symptoms or drainage from his ear.  On exam there is some erythema to the canal but no exudate is present.  Labs are reassuring and a troponin was done just to rule out the possibility of cardiac issues over the last 2 to 3 weeks.  Patient was given a prescription for naproxen 500 mg twice daily with food along with a prescription  for Cortisporin otic suspension.  He is to follow-up with his PCP or Dr. [name] if any continued problems with his ear.  Patient is also not working this weekend and will not be using earplugs.  ____________________________________________   FINAL CLINICAL IMPRESSION(S) / ED DIAGNOSES  Final diagnoses:  Otitis externa of left ear, unspecified chronicity, unspecified type     ED Discharge Orders         Ordered    naproxen (NAPROSYN) 500 MG tablet  2 times daily with meals        10/22/20 1526    neomycin-polymyxin-hydrocortisone (CORTISPORIN) OTIC solution  3 times daily        10/22/20 1526          *Please note:  Tressia Danas. was evaluated in Emergency Department on 10/22/2020 for the symptoms described in the history of  present illness. He was evaluated in the context of the global COVID-19 pandemic, which necessitated consideration that the patient might be at risk for infection with the SARS-CoV-2 virus that causes COVID-19. Institutional protocols and algorithms that pertain to the evaluation of patients at risk for COVID-19 are in a state of rapid change based on information released by regulatory bodies including the CDC and federal and state organizations. These policies and algorithms were followed during the patient's care in the ED.  Some ED evaluations and interventions may be delayed as a result of limited staffing during and the pandemic.*   Note:  This document was prepared using Dragon voice recognition software and may include unintentional dictation errors.    Johnn Hai, PA-C 10/22/20 1624    Merlyn Lot, MD 10/22/20 [phone]

## 2021-03-31 DIAGNOSIS — J019 Acute sinusitis, unspecified: Secondary | ICD-10-CM | POA: Diagnosis not present

## 2021-04-12 ENCOUNTER — Other Ambulatory Visit: Payer: Self-pay

## 2021-04-12 ENCOUNTER — Ambulatory Visit
Admission: RE | Admit: 2021-04-12 | Discharge: 2021-04-12 | Disposition: A | Discharge status: Home / Self Care | Payer: BC Managed Care – PPO | Source: Ambulatory Visit | Attending: Family Medicine | Admitting: Family Medicine

## 2021-04-12 ENCOUNTER — Ambulatory Visit
Admission: RE | Admit: 2021-04-12 | Discharge: 2021-04-12 | Disposition: A | Discharge status: Home / Self Care | Payer: BC Managed Care – PPO | Attending: Family Medicine | Admitting: Family Medicine

## 2021-04-12 ENCOUNTER — Ambulatory Visit: Payer: BC Managed Care – PPO | Admitting: Family Medicine

## 2021-04-12 ENCOUNTER — Encounter: Payer: Self-pay | Admitting: Family Medicine

## 2021-04-12 VITALS — BP 138/80 | HR 84 | Ht 70.0 in | Wt 202.0 lb

## 2021-04-12 DIAGNOSIS — G8929 Other chronic pain: Secondary | ICD-10-CM | POA: Insufficient documentation

## 2021-04-12 DIAGNOSIS — Z7689 Persons encountering health services in other specified circumstances: Secondary | ICD-10-CM | POA: Diagnosis not present

## 2021-04-12 DIAGNOSIS — M25511 Pain in right shoulder: Secondary | ICD-10-CM | POA: Diagnosis not present

## 2021-04-12 DIAGNOSIS — Z1211 Encounter for screening for malignant neoplasm of colon: Secondary | ICD-10-CM | POA: Diagnosis not present

## 2021-04-12 MED ORDER — MELOXICAM 15 MG PO TABS: 15.0000 mg | ORAL_TABLET | Freq: Every day | ORAL | 0 refills | Status: DC

## 2021-04-12 NOTE — Progress Notes (Signed)
Date:  04/12/2021   Name:  Chris Spence.   DOB:  03-23-1971   MRN:  [id number]   Chief Complaint: Establish Care and Shoulder Pain  Patient is a 50 year old male who presents for a establish care exam. The patient reports the following problems: shoulder. Health maintenance has been reviewed colonoscopy.    Shoulder Pain  The pain is present in the right shoulder. This is a new problem. The current episode started more than 1 month ago. There has been a history of trauma (roller blade / fell forward/ ? subluxed). The problem occurs constantly. Progression since onset: no improvement of shoulder pain or range of motion. The quality of the pain is described as aching. The pain is at a severity of 7/10. Associated symptoms include a limited range of motion. Pertinent negatives include no joint swelling, numbness or tingling. The symptoms are aggravated by activity. He has tried NSAIDS (Exedrine) for the symptoms. The treatment provided no relief.   Lab Results  Component Value Date   CREATININE 0.83 10/22/2020   BUN 13 10/22/2020   NA 136 10/22/2020   K 4.1 10/22/2020   CL 106 10/22/2020   CO2 23 10/22/2020   No results found for: CHOL, HDL, LDLCALC, LDLDIRECT, TRIG, CHOLHDL No results found for: TSH No results found for: HGBA1C Lab Results  Component Value Date   WBC 6.9 10/22/2020   HGB 16.4 10/22/2020   HCT 47.1 10/22/2020   MCV 82.5 10/22/2020   PLT 185 10/22/2020   No results found for: ALT, AST, GGT, ALKPHOS, BILITOT   Review of Systems  Constitutional: Negative.   HENT:  Positive for hearing loss.   Respiratory:  Negative for chest tightness and shortness of breath.   Cardiovascular: Negative.   Gastrointestinal: Negative.   Genitourinary: Negative.   Musculoskeletal:  Positive for arthralgias and myalgias.  Skin: Negative.   Neurological:  Negative for tingling and numbness.  Hematological:  Negative for adenopathy. Does not bruise/bleed easily.   There  are no problems to display for this patient.   Allergies  Allergen Reactions   Tramadol Nausea And Vomiting    Past Surgical History:  Procedure Laterality Date   APPENDECTOMY     CERVICAL FUSION     C-6 and C-7   EYE SURGERY     R) eye- "growth removed"  L) eye- removed foreign object    Social History   Tobacco Use   Smoking status: Every Day    Packs/day: 1.00    Types: Cigarettes   Smokeless tobacco: Never   Tobacco comments:    patches and pills discussed  Substance Use Topics   Alcohol use: Yes   Drug use: No     Medication list has been reviewed and updated.  Current Meds  Medication Sig   Multiple Vitamins-Minerals (ONE-A-DAY MENS HEALTH FORMULA PO) Take 1 tablet by mouth daily.    PHQ 2/9 Scores 04/12/2021 12/28/2017  PHQ - 2 Score 0 0  PHQ- 9 Score 0 0    GAD 7 : Generalized Anxiety Score 04/12/2021  Nervous, Anxious, on Edge 1  Control/stop worrying 0  Worry too much - different things 0  Trouble relaxing 1  Restless 3  Easily annoyed or irritable 3  Afraid - awful might happen 0  Total GAD 7 Score 8  Anxiety Difficulty Not difficult at all    BP Readings from Last 3 Encounters:  04/12/21 138/80  10/22/20 (!) 143/77  01/14/18 Marland Kitchen)  148/91    Physical Exam Vitals and nursing note reviewed.  HENT:     Head: Normocephalic.     Right Ear: Tympanic membrane and external ear normal.     Left Ear: Tympanic membrane and external ear normal.     Nose: Nose normal.  Eyes:     General: No scleral icterus.       Right eye: No discharge.        Left eye: No discharge.     Conjunctiva/sclera: Conjunctivae normal.     Pupils: Pupils are equal, round, and reactive to light.  Neck:     Thyroid: No thyromegaly.     Vascular: No JVD.     Trachea: No tracheal deviation.  Cardiovascular:     Rate and Rhythm: Normal rate and regular rhythm.     Heart sounds: Normal heart sounds. No murmur heard.   No friction rub. No gallop.  Pulmonary:     Effort:  No respiratory distress.     Breath sounds: Normal breath sounds. No wheezing, rhonchi or rales.  Abdominal:     General: Bowel sounds are normal.     Palpations: Abdomen is soft. There is no mass.     Tenderness: There is no abdominal tenderness. There is no guarding or rebound.  Musculoskeletal:        General: No tenderness.     Right shoulder: No swelling, deformity, effusion, tenderness, bony tenderness or crepitus. Decreased range of motion. Decreased strength.     Cervical back: Normal range of motion and neck supple.  Lymphadenopathy:     Cervical: No cervical adenopathy.  Skin:    General: Skin is warm.     Findings: No rash.  Neurological:     Mental Status: He is alert and oriented to person, place, and time.     Cranial Nerves: No cranial nerve deficit.     Deep Tendon Reflexes: Reflexes are normal and symmetric.    Wt Readings from Last 3 Encounters:  04/12/21 202 lb (91.6 kg)  10/22/20 200 lb (90.7 kg)  01/14/18 200 lb (90.7 kg)    BP 138/80   Pulse 84   Ht 5\' 10"  (1.778 m)   Wt 202 lb (91.6 kg)   BMI 28.98 kg/m   Assessment and Plan:  1. Establishing care with new doctor, encounter for Patient reestablishing care for primary care physician.  Patient will be scheduling a physical in the future and we will be referring to sports medicine for concern below  2. Colon cancer screening Discussed with patient and referral made to gastroenterology. - Ambulatory referral to Gastroenterology  3. Chronic right shoulder pain New onset.  Persistent.  Relatively stable.  However continued pain with limited range of motion and pain with hyperextension.  We will treat with meloxicam 15 mg once a day obtain a x-ray of the right shoulder and refer to sports medicine. - meloxicam (MOBIC) 15 MG tablet; Take 1 tablet (15 mg total) by mouth daily.  Dispense: 30 tablet; Refill: 0 - DG Shoulder Right

## 2021-04-12 NOTE — Patient Instructions (Signed)

## 2021-04-13 ENCOUNTER — Telehealth: Payer: Self-pay

## 2021-04-13 NOTE — Telephone Encounter (Signed)
[facility] with [facility] calling with shoulder x-ray results. Reports that pulmonary nodule is noted. [name] notified. Results are in [place]. [name] reports [name] is not in the office today, will be in tomorrow.

## 2021-04-14 ENCOUNTER — Other Ambulatory Visit: Payer: Self-pay

## 2021-04-14 ENCOUNTER — Telehealth: Payer: Self-pay

## 2021-04-14 DIAGNOSIS — R918 Other nonspecific abnormal finding of lung field: Secondary | ICD-10-CM

## 2021-04-14 NOTE — Progress Notes (Signed)
Xray ordered for chest/ pulmonary nodules

## 2021-04-14 NOTE — Telephone Encounter (Signed)
Copied from [place] [id number]. Topic: General - Other >> Apr 14, 2021 10:00 AM Leward Quan A wrote: Reason for CRM: Patient called in stated that he had a missed call from the office say that he was waiting on a call about his Xray since yesterday but ask if he does not answer to please leave a detailed message.  Ph# [phone]

## 2021-04-14 NOTE — Progress Notes (Signed)
Acknowledged, I will take care of the musculoskeletal concerns and [name] can see him for the other findings noted. Thanks

## 2021-04-15 ENCOUNTER — Ambulatory Visit
Admission: RE | Admit: 2021-04-15 | Discharge: 2021-04-15 | Disposition: A | Discharge status: Home / Self Care | Payer: BC Managed Care – PPO | Attending: Family Medicine | Admitting: Family Medicine

## 2021-04-15 ENCOUNTER — Ambulatory Visit
Admission: RE | Admit: 2021-04-15 | Discharge: 2021-04-15 | Disposition: A | Discharge status: Home / Self Care | Payer: BC Managed Care – PPO | Source: Ambulatory Visit | Attending: Family Medicine | Admitting: Family Medicine

## 2021-04-15 ENCOUNTER — Other Ambulatory Visit: Payer: Self-pay

## 2021-04-15 DIAGNOSIS — Z8709 Personal history of other diseases of the respiratory system: Secondary | ICD-10-CM | POA: Diagnosis not present

## 2021-04-15 DIAGNOSIS — R918 Other nonspecific abnormal finding of lung field: Secondary | ICD-10-CM | POA: Insufficient documentation

## 2021-04-15 DIAGNOSIS — Z9889 Other specified postprocedural states: Secondary | ICD-10-CM | POA: Diagnosis not present

## 2021-04-15 NOTE — Telephone Encounter (Signed)
See X-Ray result notes.  Attempted to reach patient multiple times with no success.  Patient has an appointment with Dr. Zigmund Daniel 04/22/21.

## 2021-04-22 ENCOUNTER — Encounter: Payer: BC Managed Care – PPO | Admitting: Family Medicine

## 2021-04-26 ENCOUNTER — Encounter: Payer: BC Managed Care – PPO | Admitting: Family Medicine

## 2021-05-16 DIAGNOSIS — Z20822 Contact with and (suspected) exposure to covid-19: Secondary | ICD-10-CM | POA: Diagnosis not present

## 2021-05-16 DIAGNOSIS — Z03818 Encounter for observation for suspected exposure to other biological agents ruled out: Secondary | ICD-10-CM | POA: Diagnosis not present

## 2021-05-24 ENCOUNTER — Encounter: Payer: BC Managed Care – PPO | Admitting: Family Medicine

## 2021-06-07 ENCOUNTER — Encounter: Payer: BC Managed Care – PPO | Admitting: Family Medicine

## 2021-07-24 ENCOUNTER — Other Ambulatory Visit: Payer: Self-pay

## 2021-07-24 ENCOUNTER — Encounter: Payer: Self-pay | Admitting: Emergency Medicine

## 2021-07-24 ENCOUNTER — Emergency Department
Admission: EM | Admit: 2021-07-24 | Discharge: 2021-07-24 | Disposition: A | Discharge status: Home / Self Care | Payer: BC Managed Care – PPO | Attending: Emergency Medicine | Admitting: Emergency Medicine

## 2021-07-24 DIAGNOSIS — K047 Periapical abscess without sinus: Secondary | ICD-10-CM | POA: Diagnosis not present

## 2021-07-24 DIAGNOSIS — R220 Localized swelling, mass and lump, head: Secondary | ICD-10-CM | POA: Diagnosis not present

## 2021-07-24 DIAGNOSIS — F17210 Nicotine dependence, cigarettes, uncomplicated: Secondary | ICD-10-CM | POA: Diagnosis not present

## 2021-07-24 MED ORDER — PENICILLIN V POTASSIUM 500 MG PO TABS: 500.0000 mg | ORAL_TABLET | Freq: Three times a day (TID) | ORAL | 0 refills | Status: AC

## 2021-07-24 MED ORDER — DIPHENHYDRAMINE HCL 25 MG PO CAPS
25.0000 mg | ORAL_CAPSULE | Freq: Once | ORAL | Status: AC
  Administered 2021-07-24: 25 mg via ORAL
  Filled 2021-07-24: qty 1

## 2021-07-24 NOTE — Discharge Instructions (Signed)
You have been seen in the emergency room today and diagnosed with dental abscess.  He will be treated with a course of oral antibiotics.  After you finish these antibiotics you should follow-up with a dentist for evaluation.  I know that you are concerned that you could have had an allergic reaction to something.  You should take over-the-counter Benadryl for the next 24 hours if this is your concern.  Return to the emergency room if you have increased swelling, shortness of breath, or any new symptoms.

## 2021-07-24 NOTE — ED Triage Notes (Signed)
Pt via POV from home. Pt c/o L sided facial swelling, pt states it is painful to touch. Pt states is also is painful to open and close his mouth but pt is able to do it. Denies any dental issues. Pt is A&Ox4 and NAD.

## 2021-07-24 NOTE — ED Provider Notes (Signed)
[facility] Emergency Department Provider Note   ____________________________________________   Event Date/Time   First MD Initiated Contact with Patient 07/24/21 1542     (approximate)  I have reviewed the triage vital signs and the nursing notes.   HISTORY  Chief Complaint Facial Swelling   HPI Chris Spence. is a 51 y.o. male patient presents to the emergency room with complaints of facial swelling to the left jaw times the past 4 to 6 hours.  Patient reports that the swelling started this afternoon and increased drastically within a 1 hour span of time.  He reports that the side of his face became twice is normal size.  He began to have pain in front of the left ear and into the left jaw.  Patient states that when he arrived to the emergency room he noticed that the swelling to his jaw had almost completely resolved.  However the pain remained.  Patient denies having any URI symptoms.  Patient denies having any dental pain or dental issues currently.  Patient reports that he is concerned that he could have had allergic reaction to something.  However, he denies any new exposure to foods/drinks/etc, in the last 24 hours.  Patient is in no acute distress at this time.  And has minimal swelling noted to the face specifically the left jaw.  However he is noted to be tender to touch to the left jaw.  Patient reports the pain is a 2 out of 10 and throbbing in nature.  Patient has taken nothing to alleviate the pain at this time.  History reviewed. No pertinent past medical history.  There are no problems to display for this patient.   Past Surgical History:  Procedure Laterality Date   APPENDECTOMY     CERVICAL FUSION     C-6 and C-7   EYE SURGERY     R) eye- "growth removed"  L) eye- removed foreign object    Prior to Admission medications   Medication Sig Start Date End Date Taking? Authorizing Provider  penicillin v potassium (VEETID) 500 MG tablet  Take 1 tablet (500 mg total) by mouth 3 (three) times daily for 7 days. 07/24/21 07/31/21 Yes Willaim Rayas, NP  meloxicam (MOBIC) 15 MG tablet Take 1 tablet (15 mg total) by mouth daily. 04/12/21   Juline Patch, MD  Multiple Vitamins-Minerals (ONE-A-DAY MENS HEALTH FORMULA PO) Take 1 tablet by mouth daily.    [provider]    Allergies Tramadol  Family History  Problem Relation Age of Onset   Cancer Mother    Cancer Sister    Diabetes Maternal Grandmother     Social History Social History   Tobacco Use   Smoking status: Every Day    Packs/day: 1.00    Types: Cigarettes   Smokeless tobacco: Never   Tobacco comments:    patches and pills discussed  Substance Use Topics   Alcohol use: Yes   Drug use: No    Review of Systems  Constitutional: No fever/chills Eyes: No visual changes. ENT: No sore throat.  Patient has minimal swelling noted to the left side of jaw. Cardiovascular: Denies chest pain. Respiratory: Denies shortness of breath. Gastrointestinal: No abdominal pain.  No nausea, no vomiting.  No diarrhea.  No constipation. Genitourinary: Negative for dysuria. Musculoskeletal: Negative for back pain. Skin: Negative for rash. Neurological: Negative for headaches, focal weakness or numbness.   ____________________________________________   PHYSICAL EXAM:  VITAL SIGNS: ED  Triage Vitals  Enc Vitals Group     BP 07/24/21 1512 (!) 151/76     Pulse Rate 07/24/21 1512 73     Resp 07/24/21 1512 18     Temp 07/24/21 1512 98.6 F (37 C)     Temp Source 07/24/21 1512 Oral     SpO2 07/24/21 1512 97 %     Weight 07/24/21 1510 200 lb (90.7 kg)     Height 07/24/21 1510 5\' 10"  (1.778 m)     Head Circumference --      Peak Flow --      Pain Score 07/24/21 1510 4     Pain Loc --      Pain Edu? --      Excl. in [place]? --     Constitutional: Alert and oriented. Well appearing and in no acute distress. Eyes: Conjunctivae are normal. PERRL. EOMI. Head:  Atraumatic. Nose: No congestion/rhinnorhea. Mouth/Throat: Mucous membranes are moist.  Oropharynx non-erythematous.  Patient has minimal swelling noted to the left side of jaw.  He has some redness and swelling noted to the back lower left molar region.  There is no pus or drainage noted.  Patient is tender to touch over the back left lower jaw region.  Patient did report some tenderness in front of the left ear however left ear exam is normal. Neck: No stridor.   Cardiovascular: Normal rate, regular rhythm. Grossly normal heart sounds.  Good peripheral circulation. Respiratory: Normal respiratory effort.  No retractions. Lungs CTAB. Gastrointestinal: Soft and nontender. No distention. No abdominal bruits. No CVA tenderness. Musculoskeletal: No lower extremity tenderness nor edema.  No joint effusions. Neurologic:  Normal speech and language. No gross focal neurologic deficits are appreciated. No gait instability. Skin:  Skin is warm, dry and intact. No rash noted. Psychiatric: Mood and affect are normal. Speech and behavior are normal.  ____________________________________________   LABS (all labs ordered are listed, but only abnormal results are displayed)  Labs Reviewed - No data to display ____________________________________________  EKG   ____________________________________________  RADIOLOGY  ED MD interpretation:    Official radiology report(s): No results found.  ____________________________________________   PROCEDURES  Procedure(s) performed: None  Procedures  Critical Care performed: No  ____________________________________________   INITIAL IMPRESSION / ASSESSMENT AND PLAN / ED COURSE     Patient is 51 year old male presents to the emergency room for pain and swelling to the left lower jawline within the last 4 to 6 hours. Please see HPI for full history.  I will give Benadryl 25 mg p.o. while in the emergency room for oral swelling as patient is  concerned that he has had some sort of allergic reaction to unknown substance.  I have low suspicion for this.  I feel the patient most likely has the start of dental abscess. We will treat dental abscess with Pen-VK 500 mg 3 times a day for the next 7 days. Patient will follow-up with a dentist after antibiotics for further evaluation of dental caries or need for dental treatment. Patient will be discharged home in stable condition at this time.     ____________________________________________   FINAL CLINICAL IMPRESSION(S) / ED DIAGNOSES  Final diagnoses:  Dental abscess     ED Discharge Orders          Ordered    penicillin v potassium (VEETID) 500 MG tablet  3 times daily        07/24/21 1609  Note:  This document was prepared using Dragon voice recognition software and may include unintentional dictation errors.     Willaim Rayas, NP 07/24/21 1637    Harvest Dark, MD 07/24/21 2205

## 2021-08-10 DIAGNOSIS — M7711 Lateral epicondylitis, right elbow: Secondary | ICD-10-CM | POA: Diagnosis not present

## 2021-11-09 ENCOUNTER — Encounter: Payer: Self-pay | Admitting: Nurse Practitioner

## 2021-11-09 ENCOUNTER — Ambulatory Visit: Payer: Self-pay | Admitting: Nurse Practitioner

## 2021-11-09 DIAGNOSIS — Z113 Encounter for screening for infections with a predominantly sexual mode of transmission: Secondary | ICD-10-CM

## 2021-11-09 DIAGNOSIS — A539 Syphilis, unspecified: Secondary | ICD-10-CM

## 2021-11-09 LAB — HM HEPATITIS C SCREENING LAB: HM Hepatitis Screen: NEGATIVE

## 2021-11-09 LAB — HM HIV SCREENING LAB: HM HIV Screening: NEGATIVE

## 2021-11-09 LAB — HEPATITIS B SURFACE ANTIGEN: Hepatitis B Surface Ag: NONREACTIVE

## 2021-11-09 MED ORDER — PENICILLIN G BENZATHINE 1200000 UNIT/2ML IM SUSY
2.4000 10*6.[IU] | PREFILLED_SYRINGE | Freq: Once | INTRAMUSCULAR | Status: AC
  Administered 2021-11-09: 2.4 10*6.[IU] via INTRAMUSCULAR

## 2021-11-09 NOTE — Progress Notes (Signed)
Pt here for STD screening and treatment of Syphilis.  Bicillin 2.4 MU given IM without any complications.  Windle Guard, RN

## 2021-11-09 NOTE — Progress Notes (Signed)
[facility] Department STI clinic/screening visit  Subjective:  Chris Stencel. is a 51 y.o. male being seen today for an STI screening visit. The patient reports they do not have symptoms.    Patient has the following medical conditions:  There are no problems to display for this patient.    Chief Complaint  Patient presents with   SEXUALLY TRANSMITTED DISEASE    Screening    HPI  Patient reports to clinic today for STD screening.  Patient states he was notified by DIS that he has some positive test results for Syphilis after attempting to donate blood.  Does the patient or their partner desires a pregnancy in the next year? No  Screening for MPX risk: Does the patient have an unexplained rash? No Is the patient MSM? No Does the patient endorse multiple sex partners or anonymous sex partners? No Did the patient have close or sexual contact with a person diagnosed with MPX? No Has the patient traveled outside the [place] where MPX is endemic? No Is there a high clinical suspicion for MPX-- evidenced by one of the following No  -Unlikely to be chickenpox  -Lymphadenopathy  -Rash that present in same phase of evolution on any given body part   See flowsheet for further details and programmatic requirements.    There is no immunization history on file for this patient.   The following portions of the patient's history were reviewed and updated as appropriate: allergies, current medications, past medical history, past social history, past surgical history and problem list.  Objective:  There were no vitals filed for this visit.  Physical Exam Constitutional:      Appearance: Normal appearance.  HENT:     Head: Normocephalic.     Right Ear: External ear normal.     Left Ear: External ear normal.     Nose: Nose normal.     Mouth/Throat:     Lips: Pink.     Mouth: Mucous membranes are moist.  Pulmonary:     Effort: Pulmonary effort is normal.   Musculoskeletal:     Cervical back: Full passive range of motion without pain and normal range of motion.  Skin:    General: Skin is warm and dry.  Neurological:     Mental Status: He is alert and oriented to person, place, and time.  Psychiatric:        Attention and Perception: Attention normal.        Mood and Affect: Mood normal.        Speech: Speech normal.        Behavior: Behavior normal. Behavior is cooperative.      Assessment and Plan:  Chris Finkler. is a 51 y.o. male presenting to the [facility] Department for STI screening  1. Screening examination for venereal disease -51 year old male in clinic for blood work and treatment.  Patient states he was notified by DIS that he has some positive test results for Syphilis after attempting to donate blood. -Patient does not have STI symptoms Patient accepted bloodwork for HIV/RPR, HBV/HCV. Patient meets criteria for HepB screening? Yes. Ordered? Yes Patient meets criteria for HepC screening? Yes. Ordered? Yes Recommended condom use with all sex Discussed importance of condom use for STI prevent  Discussed time line for [facility] results and that patient will be called with positive results and encouraged patient to call if he had not heard in 2 weeks  - Syphilis Serology,  [facility] - HIV/HCV [facility] - HBV Antigen/Antibody [facility]  2. Syphilis -Please treat patient today with Bicillin 2.4 million units, will determine if additional doses after blood work is received.  Patient advised to abstain from sex for 14 days.  Will notify patient of results.    - penicillin g benzathine (BICILLIN LA) 1200000 UNIT/2ML injection 2.4 Million Units   Return if symptoms worsen or fail to improve.    Gregary Cromer, FNP

## 2021-11-22 ENCOUNTER — Emergency Department: Payer: BC Managed Care – PPO

## 2021-11-22 ENCOUNTER — Emergency Department
Admission: EM | Admit: 2021-11-22 | Discharge: 2021-11-22 | Disposition: A | Discharge status: Home / Self Care | Payer: BC Managed Care – PPO | Attending: Emergency Medicine | Admitting: Emergency Medicine

## 2021-11-22 ENCOUNTER — Encounter: Payer: Self-pay | Admitting: Emergency Medicine

## 2021-11-22 DIAGNOSIS — R079 Chest pain, unspecified: Secondary | ICD-10-CM | POA: Diagnosis not present

## 2021-11-22 DIAGNOSIS — S29012A Strain of muscle and tendon of back wall of thorax, initial encounter: Secondary | ICD-10-CM | POA: Insufficient documentation

## 2021-11-22 DIAGNOSIS — Y9301 Activity, walking, marching and hiking: Secondary | ICD-10-CM | POA: Insufficient documentation

## 2021-11-22 DIAGNOSIS — X58XXXA Exposure to other specified factors, initial encounter: Secondary | ICD-10-CM | POA: Diagnosis not present

## 2021-11-22 DIAGNOSIS — T148XXA Other injury of unspecified body region, initial encounter: Secondary | ICD-10-CM

## 2021-11-22 DIAGNOSIS — S299XXA Unspecified injury of thorax, initial encounter: Secondary | ICD-10-CM | POA: Diagnosis not present

## 2021-11-22 DIAGNOSIS — R9431 Abnormal electrocardiogram [ECG] [EKG]: Secondary | ICD-10-CM | POA: Diagnosis not present

## 2021-11-22 LAB — BASIC METABOLIC PANEL
Anion gap: 6 (ref 5–15)
BUN: 16 mg/dL (ref 6–20)
CO2: 26 mmol/L (ref 22–32)
Calcium: 9.3 mg/dL (ref 8.9–10.3)
Chloride: 107 mmol/L (ref 98–111)
Creatinine, Ser: 1 mg/dL (ref 0.61–1.24)
GFR, Estimated: >60 mL/min (ref >60)
Glucose, Bld: 127 mg/dL — ABNORMAL HIGH (ref 70–99)
Potassium: 3.9 mmol/L (ref 3.5–5.1)
Sodium: 139 mmol/L (ref 135–145)

## 2021-11-22 LAB — CBC
HCT: 44.9 % (ref 39.0–52.0)
Hemoglobin: 15.1 g/dL (ref 13.0–17.0)
MCH: 28 pg (ref 26.0–34.0)
MCHC: 33.6 g/dL (ref 30.0–36.0)
MCV: 83.1 fL (ref 80.0–100.0)
Platelets: 146 10*3/uL — ABNORMAL LOW (ref 150–400)
RBC: 5.4 MIL/uL (ref 4.22–5.81)
RDW: 13.3 % (ref 11.5–15.5)
WBC: 8.3 10*3/uL (ref 4.0–10.5)
nRBC: 0 % (ref 0.0–0.2)

## 2021-11-22 LAB — TROPONIN I (HIGH SENSITIVITY)
Troponin I (High Sensitivity): <2 ng/L (ref <18)
Troponin I (High Sensitivity): <2 ng/L (ref <18)

## 2021-11-22 MED ORDER — NAPROXEN 500 MG PO TABS: 500.0000 mg | ORAL_TABLET | Freq: Two times a day (BID) | ORAL | 2 refills | Status: DC

## 2021-11-22 MED ORDER — CYCLOBENZAPRINE HCL 10 MG PO TABS
10.0000 mg | ORAL_TABLET | Freq: Once | ORAL | Status: AC
  Administered 2021-11-22: 10 mg via ORAL
  Filled 2021-11-22: qty 1

## 2021-11-22 MED ORDER — CYCLOBENZAPRINE HCL 10 MG PO TABS: 10.0000 mg | ORAL_TABLET | Freq: Three times a day (TID) | ORAL | 0 refills | Status: DC | PRN

## 2021-11-22 MED ORDER — NAPROXEN 500 MG PO TABS
500.0000 mg | ORAL_TABLET | Freq: Once | ORAL | Status: AC
  Administered 2021-11-22: 500 mg via ORAL
  Filled 2021-11-22: qty 1

## 2021-11-22 NOTE — Discharge Instructions (Addendum)
Apply heat.  Take medications as needed as prescribed.  Follow-up with your doctor in 2 days.  Return to the hospital for worsening pain, shortness of breath, chest pain, or abdominal pain.

## 2021-11-22 NOTE — ED Provider Notes (Signed)
[facility] Provider Note    Event Date/Time   First MD Initiated Contact with Patient 11/22/21 [phone]     (approximate)   History   Back Pain   HPI  Chris Spence. is a 51 y.o. male with a history of a cervical fusion who presents for evaluation of left upper back pain.  Patient reports that he was in usual state of health around 5 PM when he was going out of the house to take the trash.  His he was coming down the steps he felt a sudden pull on his left upper back and then developed pain.  He reports that the pain is a 3 out of 10 if he is not moving but if he moves his torso or his left arm the pain becomes severe.  The pain is reproducible with palpation of the left upper back.  No chest pain, no shortness of breath, no history of PE or DVT, no recent travel immobilization, no leg pain or swelling, no hemoptysis or exogenous hormones.  Patient reports that he was trying to get ready for work but he did not feel that he was going to be able to work since the pain was getting worse with movement.  He denies neck pain, numbness or weakness of his extremities.     History reviewed. No pertinent past medical history.  Past Surgical History:  Procedure Laterality Date   APPENDECTOMY     CERVICAL FUSION     C-6 and C-7   EYE SURGERY     R) eye- "growth removed"  L) eye- removed foreign object     Physical Exam   Triage Vital Signs: ED Triage Vitals  Enc Vitals Group     BP 11/22/21 0128 (!) 154/96     Pulse Rate 11/22/21 0126 88     Resp 11/22/21 0126 20     Temp 11/22/21 0126 98 F (36.7 C)     Temp Source 11/22/21 0126 Oral     SpO2 11/22/21 0126 98 %     Weight 11/22/21 0127 204 lb (92.5 kg)     Height 11/22/21 0127 '5\' 10"'$  (1.778 m)     Head Circumference --      Peak Flow --      Pain Score --      Pain Loc --      Pain Edu? --      Excl. in [place]? --     Most recent vital signs: Vitals:   11/22/21 0128 11/22/21 0533  BP: (!) 154/96 (!)  147/95  Pulse:  75  Resp:  16  Temp:    SpO2:  95%     Constitutional: Alert and oriented. Well appearing and in no apparent distress. HEENT:      Head: Normocephalic and atraumatic.         Eyes: Conjunctivae are normal. Sclera is non-icteric.       Mouth/Throat: Mucous membranes are moist.       Neck: Supple with no signs of meningismus. Cardiovascular: Regular rate and rhythm. No murmurs, gallops, or rubs. 2+ symmetrical distal pulses are present in all extremities.  Respiratory: Normal respiratory effort. Lungs are clear to auscultation bilaterally.  Gastrointestinal: Soft, non tender, and non distended with positive bowel sounds. No rebound or guarding. Genitourinary: No CVA tenderness. Musculoskeletal: No midline spine tenderness.  Palpation of the trapezius muscle on the L reproduce the pain. No rash Neurologic: Normal speech and language. Face  is symmetric. Moving all extremities. No gross focal neurologic deficits are appreciated. Skin: Skin is warm, dry and intact. No rash noted. Psychiatric: Mood and affect are normal. Speech and behavior are normal.  ED Results / Procedures / Treatments   Labs (all labs ordered are listed, but only abnormal results are displayed) Labs Reviewed  BASIC METABOLIC PANEL - Abnormal; Notable for the following components:      Result Value   Glucose, Bld 127 (*)    All other components within normal limits  CBC - Abnormal; Notable for the following components:   Platelets 146 (*)    All other components within normal limits  TROPONIN I (HIGH SENSITIVITY)  TROPONIN I (HIGH SENSITIVITY)     EKG  ED ECG REPORT I, Rudene Re, the attending physician, personally viewed and interpreted this ECG.  Sinus rhythm with a rate of 87, normal intervals, normal axis, no ST elevations or depressions.  RADIOLOGY I, Rudene Re, attending MD, have personally viewed and interpreted the images obtained during this visit as below:  CXR  negative   ___________________________________________________ Interpretation by Radiologist:  DG Chest 2 View  Result Date: 11/22/2021 CLINICAL DATA:  51 year old male with left upper back and shoulder pain since yesterday, exacerbated by movement. EXAM: CHEST - 2 VIEW COMPARISON:  Chest radiographs 06/15/2020. FINDINGS: Lower lung volumes on both views. Mediastinal contours remain within normal limits. Visualized tracheal air column is within normal limits. Mild crowding of lung markings with no pneumothorax, pleural effusion, pulmonary edema, or confluent lung opacity. Negative visible bowel gas. Prior lower cervical ACDF. No thoracic compression fracture identified. No acute osseous abnormality identified. IMPRESSION: Lower lung volumes. No acute cardiopulmonary abnormality. Electronically Signed   By: Genevie Ann M.D.   On: 11/22/2021 05:34      PROCEDURES:  Critical Care performed: No  Procedures    IMPRESSION / MDM / ASSESSMENT AND PLAN / ED COURSE  I reviewed the triage vital signs and the nursing notes.  51 y.o. male with a history of a cervical fusion who presents for evaluation of left upper back pain.  With sudden onset of left sided upper back pain that is worse with movement and started after he was going down the steps with a trash bag.  Pain is reproducible on palpation and worse with movement of the torso.  There is no midline tenderness, lungs are clear to auscultation, heart regular rate and rhythm, vitals are within normal limits, there is no rash.  Patient does have pain with palpation of the trapezius on the left.  Ddx: Muscle strain versus rib fracture versus pneumothorax.  Doubt ACS based on the description the pain is worse with movement.  Doubt PE with no tachypnea, tachycardia, or hypoxia. Aortic dissection considered, however there were with no typical symptoms of chest pain radiating through to intrascapular back, no severe hypertension, symmetric bilateral radial  pulses, no associated neurologic deficits, no associated abdominal or lower extremity symptoms, no marfanoid features or evidence of underlying connective tissue disorder, and chest x-ray without evidence of mediastinal widening. Therefore will not pursue further diagnosis at this time.   Plan: EKG, troponin x 2, BMP, CBC, CXR.  Will give Flexeril and naproxen for pain   MEDICATIONS GIVEN IN ED: Medications  cyclobenzaprine (FLEXERIL) tablet 10 mg (10 mg Oral Given 11/22/21 0450)  naproxen (NAPROSYN) tablet 500 mg (500 mg Oral Given 11/22/21 0450)     ED COURSE: Chest x-ray with no acute findings.  EKG and 2 high-sensitivity  troponins are negative.  Normal metabolic panel, no leukocytosis.  Pain is most likely muscular in nature.  Will discharge home on naproxen, Flexeril, and heat.  Discussed my standard return precautions and follow-up with primary care doctor.   Consults: none   EMR reviewed including records from last visit with his primary care doctor for a annual exam from November 2022    FINAL CLINICAL IMPRESSION(S) / ED DIAGNOSES   Final diagnoses:  Muscle strain     Rx / DC Orders   ED Discharge Orders          Ordered    cyclobenzaprine (FLEXERIL) 10 MG tablet  3 times daily PRN        11/22/21 0545    naproxen (NAPROSYN) 500 MG tablet  2 times daily with meals        11/22/21 0545             Note:  This document was prepared using Dragon voice recognition software and may include unintentional dictation errors.   Please note:  Patient was evaluated in Emergency Department today for the symptoms described in the history of present illness. Patient was evaluated in the context of the global COVID-19 pandemic, which necessitated consideration that the patient might be at risk for infection with the SARS-CoV-2 virus that causes COVID-19. Institutional protocols and algorithms that pertain to the evaluation of patients at risk for COVID-19 are in a state of  rapid change based on information released by regulatory bodies including the CDC and federal and state organizations. These policies and algorithms were followed during the patient's care in the ED.  Some ED evaluations and interventions may be delayed as a result of limited staffing during the pandemic.       Alfred Levins, [place], MD 11/22/21 [phone]

## 2021-11-22 NOTE — ED Triage Notes (Signed)
Pt reports he was walking down stairs on 6/12 and felt a pull and pain that has continued in the left shoulder blade since. Pain worsened with movement.

## 2022-02-15 ENCOUNTER — Encounter: Payer: Self-pay | Admitting: Emergency Medicine

## 2022-02-15 ENCOUNTER — Other Ambulatory Visit: Payer: Self-pay

## 2022-02-15 ENCOUNTER — Emergency Department
Admission: EM | Admit: 2022-02-15 | Discharge: 2022-02-15 | Disposition: A | Discharge status: Home / Self Care | Payer: BC Managed Care – PPO | Attending: Emergency Medicine | Admitting: Emergency Medicine

## 2022-02-15 DIAGNOSIS — L03211 Cellulitis of face: Secondary | ICD-10-CM | POA: Diagnosis not present

## 2022-02-15 LAB — CBC WITH DIFFERENTIAL/PLATELET
Abs Immature Granulocytes: 0.02 10*3/uL (ref 0.00–0.07)
Basophils Absolute: 0.1 10*3/uL (ref 0.0–0.1)
Basophils Relative: 1 %
Eosinophils Absolute: 0.1 10*3/uL (ref 0.0–0.5)
Eosinophils Relative: 1 %
HCT: 47 % (ref 39.0–52.0)
Hemoglobin: 15.9 g/dL (ref 13.0–17.0)
Immature Granulocytes: 0 %
Lymphocytes Relative: 29 %
Lymphs Abs: 3 10*3/uL (ref 0.7–4.0)
MCH: 28.9 pg (ref 26.0–34.0)
MCHC: 33.8 g/dL (ref 30.0–36.0)
MCV: 85.5 fL (ref 80.0–100.0)
Monocytes Absolute: 0.7 10*3/uL (ref 0.1–1.0)
Monocytes Relative: 7 %
Neutro Abs: 6.7 10*3/uL (ref 1.7–7.7)
Neutrophils Relative %: 62 %
Platelets: 182 10*3/uL (ref 150–400)
RBC: 5.5 MIL/uL (ref 4.22–5.81)
RDW: 12.5 % (ref 11.5–15.5)
WBC: 10.6 10*3/uL — ABNORMAL HIGH (ref 4.0–10.5)
nRBC: 0 % (ref 0.0–0.2)

## 2022-02-15 LAB — BASIC METABOLIC PANEL
Anion gap: 8 (ref 5–15)
BUN: 20 mg/dL (ref 6–20)
CO2: 26 mmol/L (ref 22–32)
Calcium: 9.7 mg/dL (ref 8.9–10.3)
Chloride: 104 mmol/L (ref 98–111)
Creatinine, Ser: 0.99 mg/dL (ref 0.61–1.24)
GFR, Estimated: >60 mL/min (ref >60)
Glucose, Bld: 102 mg/dL — ABNORMAL HIGH (ref 70–99)
Potassium: 4.3 mmol/L (ref 3.5–5.1)
Sodium: 138 mmol/L (ref 135–145)

## 2022-02-15 MED ORDER — SULFAMETHOXAZOLE-TRIMETHOPRIM 800-160 MG PO TABS: 1.0000 | ORAL_TABLET | Freq: Two times a day (BID) | ORAL | 0 refills | Status: DC

## 2022-02-15 MED ORDER — HYDROCODONE-ACETAMINOPHEN 5-325 MG PO TABS: 1.0000 | ORAL_TABLET | Freq: Four times a day (QID) | ORAL | 0 refills | Status: AC | PRN

## 2022-02-15 NOTE — ED Triage Notes (Signed)
Patient to ED for facial swelling on right side of nose to under right eye. Patient states hx of same many years ago that turned into cellulitis. First noted swelling yesterday.

## 2022-02-15 NOTE — ED Provider Notes (Signed)
[facility] Provider Note    Event Date/Time   First MD Initiated Contact with Patient 02/15/22 1836     (approximate)   History   Facial Swelling   HPI  Chris Vasil. is a 51 y.o. male with history of cellulitis and as listed in EMR presents to the emergency department for treatment and evaluation of right-sided facial pain and swelling.  Pain started yesterday and he noticed the swelling today.  Over the day, the swelling has progressed and the pain has not been controlled with Tylenol and Aleve.  He denies fever.  He states that about 12 years ago he had an ingrown hair inside of his nose that turned into a large facial cellulitis.  Those symptoms started similar to what he is experiencing now..      Physical Exam   Triage Vital Signs: ED Triage Vitals  Enc Vitals Group     BP 02/15/22 1713 (!) 165/97     Pulse Rate 02/15/22 1713 88     Resp 02/15/22 1713 18     Temp 02/15/22 1713 98.1 F (36.7 C)     Temp src --      SpO2 02/15/22 1713 98 %     Weight 02/15/22 1711 206 lb (93.4 kg)     Height 02/15/22 1711 '5\' 10"'$  (1.778 m)     Head Circumference --      Peak Flow --      Pain Score 02/15/22 1711 8     Pain Loc --      Pain Edu? --      Excl. in [place]? --     Most recent vital signs: Vitals:   02/15/22 1713  BP: (!) 165/97  Pulse: 88  Resp: 18  Temp: 98.1 F (36.7 C)  SpO2: 98%    General: Awake, no distress.  CV:  Good peripheral perfusion.  Resp:  Normal effort.  Abd:  No distention.  Other:  Tenderness and erythema just under the right eye and into the right side of the nose.  Increased tenderness with ophthalmoscope exam inside the right nostril.  No obvious intranasal abscess.   ED Results / Procedures / Treatments   Labs (all labs ordered are listed, but only abnormal results are displayed) Labs Reviewed  BASIC METABOLIC PANEL - Abnormal; Notable for the following components:      Result Value   Glucose, Bld 102  (*)    All other components within normal limits  CBC WITH DIFFERENTIAL/PLATELET - Abnormal; Notable for the following components:   WBC 10.6 (*)    All other components within normal limits     EKG     RADIOLOGY  Image and radiology report reviewed by me.  Not indicated.  PROCEDURES:  Critical Care performed: No  Procedures   MEDICATIONS ORDERED IN ED: Medications - No data to display   IMPRESSION / MDM / [place] / ED COURSE   I have reviewed the triage note.  Differential diagnosis includes, but is not limited to, intranasal abscess, sinusitis, dental abscess  51 year old male presenting to the emergency department for treatment and evaluation of facial pain and swelling.  See HPI and exam description for details.  Patient reports a history of an intranasal abscess secondary to ingrown hair.  He will be treated with Bactrim for what appears to be an early cellulitis.  There is no obvious drainable abscess.  Vital signs are reassuring.   ER return  precautions were discussed.  Patient is to follow-up with primary care if not improving over the next 2 to 3 days.      FINAL CLINICAL IMPRESSION(S) / ED DIAGNOSES   Final diagnoses:  Facial cellulitis     Rx / DC Orders   ED Discharge Orders          Ordered    sulfamethoxazole-trimethoprim (BACTRIM DS) 800-160 MG tablet  2 times daily        02/15/22 1850    HYDROcodone-acetaminophen (NORCO/VICODIN) 5-325 MG tablet  Every 6 hours PRN        02/15/22 1850             Note:  This document was prepared using Dragon voice recognition software and may include unintentional dictation errors.   Victorino Dike, FNP 02/16/22 0015    Blake Divine, MD 02/17/22 0002

## 2022-02-15 NOTE — Discharge Instructions (Signed)
Please follow-up with your primary care provider if not improving over the next 2 to 3 days.  If symptoms change or worsen and you are unable to schedule appointment, please return to the emergency department.

## 2022-02-15 NOTE — ED Provider Triage Note (Signed)
Emergency Medicine Provider Triage Evaluation Note  Chris Spence. , a 51 y.o. male  was evaluated in triage.  Pt complains of right nasal swelling and pain that he feels is spreading beneath his right eye. Began yesterday. Reports that this happened 1 year ago and was cellulitis. No headache or vision changes. No pain with EOMs.  Review of Systems  Positive: Right nare swelling and pain Negative: Headache, vision changes  Physical Exam  There were no vitals taken for this visit. Gen:   Awake, no distress   Resp:  Normal effort  MSK:   Moves extremities without difficulty  Other:  No obvious swelling or erythema. Normal lids and lashes  Medical Decision Making  Medically screening exam initiated at 5:10 PM.  Appropriate orders placed.  Chris Spence. was informed that the remainder of the evaluation will be completed by another provider, this initial triage assessment does not replace that evaluation, and the importance of remaining in the ED until their evaluation is complete.     Marquette Old, PA-C 02/15/22 1713

## 2022-03-06 ENCOUNTER — Ambulatory Visit (INDEPENDENT_AMBULATORY_CARE_PROVIDER_SITE_OTHER): Payer: BC Managed Care – PPO | Admitting: Family Medicine

## 2022-03-06 ENCOUNTER — Encounter: Payer: Self-pay | Admitting: Family Medicine

## 2022-03-06 VITALS — BP 130/80 | HR 64 | Ht 70.0 in | Wt 205.0 lb

## 2022-03-06 DIAGNOSIS — R351 Nocturia: Secondary | ICD-10-CM | POA: Diagnosis not present

## 2022-03-06 DIAGNOSIS — Z0000 Encounter for general adult medical examination without abnormal findings: Secondary | ICD-10-CM | POA: Diagnosis not present

## 2022-03-06 DIAGNOSIS — E785 Hyperlipidemia, unspecified: Secondary | ICD-10-CM | POA: Diagnosis not present

## 2022-03-06 DIAGNOSIS — N529 Male erectile dysfunction, unspecified: Secondary | ICD-10-CM | POA: Diagnosis not present

## 2022-03-06 DIAGNOSIS — I10 Essential (primary) hypertension: Secondary | ICD-10-CM | POA: Diagnosis not present

## 2022-03-06 DIAGNOSIS — Z1211 Encounter for screening for malignant neoplasm of colon: Secondary | ICD-10-CM

## 2022-03-06 LAB — HEMOCCULT GUIAC POC 1CARD (OFFICE): Fecal Occult Blood, POC: NEGATIVE

## 2022-03-06 MED ORDER — SILDENAFIL CITRATE 50 MG PO TABS: 50.0000 mg | ORAL_TABLET | Freq: Every day | ORAL | 5 refills | Status: DC | PRN

## 2022-03-06 NOTE — Progress Notes (Signed)
Date:  03/06/2022   Name:  Chris Spence.   DOB:  02-Jul-1970   MRN:  [id number]   Chief Complaint: Annual Exam  Patient is a 51 year old  male who presents for a comprehensive physical exam. The patient reports the following problems: ed. Health maintenance has been reviewed up to date.      Lab Results  Component Value Date   NA 138 02/15/2022   K 4.3 02/15/2022   CO2 26 02/15/2022   GLUCOSE 102 (H) 02/15/2022   BUN 20 02/15/2022   CREATININE 0.99 02/15/2022   CALCIUM 9.7 02/15/2022   GFRNONAA >60 02/15/2022   No results found for: "CHOL", "HDL", "LDLCALC", "LDLDIRECT", "TRIG", "CHOLHDL" No results found for: "TSH" No results found for: "HGBA1C" Lab Results  Component Value Date   WBC 10.6 (H) 02/15/2022   HGB 15.9 02/15/2022   HCT 47.0 02/15/2022   MCV 85.5 02/15/2022   PLT 182 02/15/2022   No results found for: "ALT", "AST", "GGT", "ALKPHOS", "BILITOT" No results found for: "25OHVITD2", "25OHVITD3", "VD25OH"   Review of Systems  Constitutional:  Negative for chills, fever and unexpected weight change.  HENT:  Negative for drooling, ear discharge, ear pain and sore throat.   Eyes:  Negative for visual disturbance.  Respiratory:  Positive for shortness of breath. Negative for cough and wheezing.   Cardiovascular:  Negative for chest pain, palpitations and leg swelling.  Gastrointestinal:  Negative for abdominal pain, blood in stool, constipation, diarrhea and nausea.  Endocrine: Negative for polydipsia.  Genitourinary:  Negative for dysuria, frequency, hematuria and urgency.  Musculoskeletal:  Negative for back pain, myalgias and neck pain.  Skin:  Negative for rash.  Allergic/Immunologic: Negative for environmental allergies.  Neurological:  Positive for headaches. Negative for dizziness.  Hematological:  Does not bruise/bleed easily.  Psychiatric/Behavioral:  Negative for suicidal ideas. The patient is not nervous/anxious.     There are no problems to  display for this patient.   Allergies  Allergen Reactions   Tramadol Nausea And Vomiting    Past Surgical History:  Procedure Laterality Date   APPENDECTOMY     CERVICAL FUSION     C-6 and C-7   EYE SURGERY     R) eye- "growth removed"  L) eye- removed foreign object    Social History   Tobacco Use   Smoking status: Every Day    Packs/day: 1.00    Years: 40.00    Total pack years: 40.00    Types: Cigarettes   Smokeless tobacco: Never   Tobacco comments:    patches and pills discussed  Vaping Use   Vaping Use: Never used  Substance Use Topics   Alcohol use: Yes    Comment: occassionally   Drug use: No     Medication list has been reviewed and updated.  Current Meds  Medication Sig   Multiple Vitamins-Minerals (ONE-A-DAY MENS HEALTH FORMULA PO) Take 1 tablet by mouth daily.   [DISCONTINUED] naproxen (NAPROSYN) 500 MG tablet Take 1 tablet (500 mg total) by mouth 2 (two) times daily with a meal.       03/06/2022    9:50 AM 04/12/2021    3:13 PM  GAD 7 : Generalized Anxiety Score  Nervous, Anxious, on Edge 0 1  Control/stop worrying 0 0  Worry too much - different things 0 0  Trouble relaxing 0 1  Restless 0 3  Easily annoyed or irritable 0 3  Afraid - awful might happen  0 0  Total GAD 7 Score 0 8  Anxiety Difficulty Not difficult at all Not difficult at all       03/06/2022    9:50 AM 04/12/2021    3:13 PM 12/28/2017    2:37 PM  Depression screen PHQ 2/9  Decreased Interest 0 0 0  Down, Depressed, Hopeless 0 0 0  PHQ - 2 Score 0 0 0  Altered sleeping 0 0 0  Tired, decreased energy 0 0 0  Change in appetite 0 0 0  Feeling bad or failure about yourself  0 0 0  Trouble concentrating 0 0 0  Moving slowly or fidgety/restless 0 0 0  Suicidal thoughts 0 0 0  PHQ-9 Score 0 0 0  Difficult doing work/chores Not difficult at all      BP Readings from Last 3 Encounters:  03/06/22 130/80  02/15/22 (!) 165/97  11/22/21 (!) 147/95    Physical  Exam Vitals and nursing note reviewed.  Constitutional:      Appearance: He is overweight.  HENT:     Head: Normocephalic.     Jaw: There is normal jaw occlusion.     Right Ear: Hearing, tympanic membrane, ear canal and external ear normal.     Left Ear: Hearing, tympanic membrane, ear canal and external ear normal.     Nose: Nose normal.     Mouth/Throat:     Lips: Pink.     Mouth: Mucous membranes are moist.     Dentition: Normal dentition.     Tongue: No lesions.     Palate: Lesions present. No mass.     Pharynx: Oropharynx is clear. Uvula midline.     Tonsils: No tonsillar exudate.     Comments: To be evaluated by oral surgery Eyes:     General: Lids are normal. Vision grossly intact. Gaze aligned appropriately. No scleral icterus.       Right eye: No discharge.        Left eye: No discharge.     Extraocular Movements: Extraocular movements intact.     Conjunctiva/sclera: Conjunctivae normal.     Right eye: Right conjunctiva is not injected.     Left eye: Left conjunctiva is not injected.     Pupils: Pupils are equal, round, and reactive to light.     Funduscopic exam:    Right eye: Red reflex present.        Left eye: Red reflex present. Neck:     Thyroid: No thyroid mass, thyromegaly or thyroid tenderness.     Vascular: Normal carotid pulses. No carotid bruit, hepatojugular reflux or JVD.     Trachea: Trachea and phonation normal. No tracheal deviation.  Cardiovascular:     Rate and Rhythm: Normal rate and regular rhythm.     Chest Wall: PMI is not displaced.     Pulses:          Carotid pulses are 2+ on the right side and 2+ on the left side.      Radial pulses are 2+ on the right side and 2+ on the left side.       Femoral pulses are 2+ on the right side and 2+ on the left side.      Popliteal pulses are 2+ on the right side and 2+ on the left side.       Dorsalis pedis pulses are 2+ on the right side and 2+ on the left side.       Posterior  tibial pulses are 2+ on  the right side and 2+ on the left side.     Heart sounds: Normal heart sounds, S1 normal and S2 normal. Heart sounds not distant. No murmur heard.    No systolic murmur is present.     No diastolic murmur is present.     No friction rub. No gallop. No S3 or S4 sounds.  Pulmonary:     Effort: Pulmonary effort is normal. No respiratory distress.     Breath sounds: Normal breath sounds. No decreased breath sounds, wheezing, rhonchi or rales.  Chest:  Breasts:    Right: Normal.     Left: Normal.  Abdominal:     General: Bowel sounds are normal.     Palpations: Abdomen is soft. There is no hepatomegaly, splenomegaly or mass.     Tenderness: There is no abdominal tenderness. There is no guarding or rebound.     Hernia: There is no hernia in the umbilical area, ventral area, left inguinal area or right inguinal area.  Genitourinary:    Penis: Normal.      Testes: Normal.        Right: Mass or tenderness not present.        Left: Mass or tenderness not present.     Epididymis:     Right: Normal.     Left: Normal.  Musculoskeletal:        General: No tenderness. Normal range of motion.     Cervical back: Full passive range of motion without pain, normal range of motion and neck supple.  Lymphadenopathy:     Head:     Right side of head: No submental or submandibular adenopathy.     Left side of head: No submental or submandibular adenopathy.     Cervical: No cervical adenopathy.     Right cervical: No superficial, deep or posterior cervical adenopathy.    Left cervical: No superficial, deep or posterior cervical adenopathy.     Upper Body:     Right upper body: No supraclavicular, axillary or pectoral adenopathy.     Left upper body: No supraclavicular, axillary or pectoral adenopathy.  Skin:    General: Skin is warm.     Capillary Refill: Capillary refill takes less than 2 seconds.     Findings: No rash.  Neurological:     General: No focal deficit present.     Mental Status: He  is alert and oriented to person, place, and time.     Cranial Nerves: No cranial nerve deficit.     Sensory: Sensation is intact.     Motor: Motor function is intact.     Coordination: Romberg sign negative.     Deep Tendon Reflexes: Reflexes are normal and symmetric.     Reflex Scores:      Tricep reflexes are 2+ on the right side and 2+ on the left side.      Bicep reflexes are 2+ on the right side and 2+ on the left side.      Brachioradialis reflexes are 2+ on the right side and 2+ on the left side.      Patellar reflexes are 2+ on the right side and 2+ on the left side.      Achilles reflexes are 2+ on the right side and 2+ on the left side. Psychiatric:        Behavior: Behavior is cooperative.     Wt Readings from Last 3 Encounters:  03/06/22 205 lb (93  kg)  02/15/22 206 lb (93.4 kg)  11/22/21 204 lb (92.5 kg)    BP 130/80   Pulse 64   Ht '5\' 10"'$  (1.778 m)   Wt 205 lb (93 kg)   BMI 29.41 kg/m   Assessment and Plan:  1. Annual physical exam Jonathan Corpus. is a 51 y.o. male who presents today for his Complete Annual Exam. He feels well. He reports exercising . He reports he is sleeping fairly well.  Immunizations are reviewed and recommendations provided.   Age appropriate screening tests are discussed. Counseling given for risk factor reduction interventions.  - Lipid Panel With LDL/HDL Ratio - POCT Occult Blood Stool - Renal Function Panel  2. Nocturia With nocturia.  We will evaluate with DRE which is normal for size shape consistency and no nodularity.  We will also obtain a PSA for current level. - PSA  3. Vasculogenic erectile dysfunction, unspecified vasculogenic erectile dysfunction type Chronic.  Persistent.  Stable.  Patient has inability to maintain erection.  We will treat with sildenafil 50 mg as needed. - sildenafil (VIAGRA) 50 MG tablet; Take 1 tablet (50 mg total) by mouth daily as needed for erectile dysfunction.  Dispense: 10 tablet; Refill:  5  4. Colon cancer screening Discussed and consultation placed to gastroenterology for colon cancer screening. - Ambulatory referral to Gastroenterology    Otilio Miu, MD

## 2022-03-07 ENCOUNTER — Other Ambulatory Visit: Payer: Self-pay

## 2022-03-07 ENCOUNTER — Telehealth: Payer: Self-pay

## 2022-03-07 DIAGNOSIS — Z1211 Encounter for screening for malignant neoplasm of colon: Secondary | ICD-10-CM

## 2022-03-07 LAB — RENAL FUNCTION PANEL
Albumin: 4.4 g/dL (ref 4.1–5.1)
BUN/Creatinine Ratio: 19 (ref 9–20)
BUN: 19 mg/dL (ref 6–24)
CO2: 22 mmol/L (ref 20–29)
Calcium: 9.2 mg/dL (ref 8.7–10.2)
Chloride: 100 mmol/L (ref 96–106)
Creatinine, Ser: 1.02 mg/dL (ref 0.76–1.27)
Glucose: 97 mg/dL (ref 70–99)
Phosphorus: 2.8 mg/dL (ref 2.8–4.1)
Potassium: 4.5 mmol/L (ref 3.5–5.2)
Sodium: 138 mmol/L (ref 134–144)
eGFR: 90 mL/min/{1.73_m2} (ref >59)

## 2022-03-07 LAB — LIPID PANEL WITH LDL/HDL RATIO
Cholesterol, Total: 221 mg/dL — ABNORMAL HIGH (ref 100–199)
HDL: 31 mg/dL — ABNORMAL LOW (ref >39)
LDL Chol Calc (NIH): 112 mg/dL — ABNORMAL HIGH (ref 0–99)
LDL/HDL Ratio: 3.6 ratio (ref 0.0–3.6)
Triglycerides: 452 mg/dL — ABNORMAL HIGH (ref 0–149)
VLDL Cholesterol Cal: 78 mg/dL — ABNORMAL HIGH (ref 5–40)

## 2022-03-07 LAB — PSA: Prostate Specific Ag, Serum: 0.6 ng/mL (ref 0.0–4.0)

## 2022-03-07 MED ORDER — FENOFIBRATE 145 MG PO TABS: 145.0000 mg | ORAL_TABLET | Freq: Every day | ORAL | 1 refills | Status: DC

## 2022-03-07 MED ORDER — NA SULFATE-K SULFATE-MG SULF 17.5-3.13-1.6 GM/177ML PO SOLN: 1.0000 | Freq: Once | ORAL | 0 refills | Status: AC

## 2022-03-07 NOTE — Telephone Encounter (Signed)
Gastroenterology Pre-Procedure Review  Request Date: 04/21/22 Requesting Physician: Dr. Marius Ditch  PATIENT REVIEW QUESTIONS: The patient responded to the following health history questions as indicated:    1. Are you having any GI issues? no 2. Do you have a personal history of Polyps? no 3. Do you have a family history of Colon Cancer or Polyps? no 4. Diabetes Mellitus? no 5. Joint replacements in the past 12 months?no 6. Major health problems in the past 3 months?no 7. Any artificial heart valves, MVP, or defibrillator?no    MEDICATIONS & ALLERGIES:    Patient reports the following regarding taking any anticoagulation/antiplatelet therapy:   Plavix, Coumadin, Eliquis, Xarelto, Lovenox, Pradaxa, Brilinta, or Effient? no Aspirin? no  Patient confirms/reports the following medications:  Current Outpatient Medications  Medication Sig Dispense Refill   Multiple Vitamins-Minerals (ONE-A-DAY MENS HEALTH FORMULA PO) Take 1 tablet by mouth daily.     sildenafil (VIAGRA) 50 MG tablet Take 1 tablet (50 mg total) by mouth daily as needed for erectile dysfunction. 10 tablet 5   No current facility-administered medications for this visit.    Patient confirms/reports the following allergies:  Allergies  Allergen Reactions   Tramadol Nausea And Vomiting    No orders of the defined types were placed in this encounter.   AUTHORIZATION INFORMATION Primary Insurance: 1D#: Group #:  Secondary Insurance: 1D#: Group #:  SCHEDULE INFORMATION: Date: 04/21/22 Time: Location: [facility]

## 2022-03-08 DIAGNOSIS — J069 Acute upper respiratory infection, unspecified: Secondary | ICD-10-CM | POA: Diagnosis not present

## 2022-03-10 ENCOUNTER — Ambulatory Visit: Payer: BC Managed Care – PPO | Admitting: Family Medicine

## 2022-03-10 ENCOUNTER — Ambulatory Visit
Admission: RE | Admit: 2022-03-10 | Discharge: 2022-03-10 | Disposition: A | Discharge status: Home / Self Care | Payer: BC Managed Care – PPO | Source: Ambulatory Visit | Attending: Family Medicine | Admitting: Family Medicine

## 2022-03-10 ENCOUNTER — Other Ambulatory Visit
Admission: RE | Admit: 2022-03-10 | Discharge: 2022-03-10 | Disposition: A | Discharge status: Home / Self Care | Payer: BC Managed Care – PPO | Source: Home / Self Care | Attending: Family Medicine | Admitting: Family Medicine

## 2022-03-10 ENCOUNTER — Ambulatory Visit
Admission: RE | Admit: 2022-03-10 | Discharge: 2022-03-10 | Disposition: A | Discharge status: Home / Self Care | Payer: BC Managed Care – PPO | Attending: Family Medicine | Admitting: Family Medicine

## 2022-03-10 ENCOUNTER — Encounter: Payer: Self-pay | Admitting: Family Medicine

## 2022-03-10 VITALS — BP 120/80 | HR 84 | Temp 98.3°F | Ht 70.0 in | Wt 204.0 lb

## 2022-03-10 DIAGNOSIS — R051 Acute cough: Secondary | ICD-10-CM | POA: Diagnosis not present

## 2022-03-10 DIAGNOSIS — R6883 Chills (without fever): Secondary | ICD-10-CM

## 2022-03-10 DIAGNOSIS — R062 Wheezing: Secondary | ICD-10-CM

## 2022-03-10 DIAGNOSIS — R059 Cough, unspecified: Secondary | ICD-10-CM | POA: Diagnosis not present

## 2022-03-10 LAB — CBC WITH DIFFERENTIAL/PLATELET
Abs Immature Granulocytes: 0.03 10*3/uL (ref 0.00–0.07)
Basophils Absolute: 0.1 10*3/uL (ref 0.0–0.1)
Basophils Relative: 1 %
Eosinophils Absolute: 0.2 10*3/uL (ref 0.0–0.5)
Eosinophils Relative: 2 %
HCT: 46.8 % (ref 39.0–52.0)
Hemoglobin: 15.7 g/dL (ref 13.0–17.0)
Immature Granulocytes: 0 %
Lymphocytes Relative: 31 %
Lymphs Abs: 2.7 10*3/uL (ref 0.7–4.0)
MCH: 28.2 pg (ref 26.0–34.0)
MCHC: 33.5 g/dL (ref 30.0–36.0)
MCV: 84.2 fL (ref 80.0–100.0)
Monocytes Absolute: 0.8 10*3/uL (ref 0.1–1.0)
Monocytes Relative: 9 %
Neutro Abs: 4.8 10*3/uL (ref 1.7–7.7)
Neutrophils Relative %: 57 %
Platelets: 187 10*3/uL (ref 150–400)
RBC: 5.56 MIL/uL (ref 4.22–5.81)
RDW: 13.1 % (ref 11.5–15.5)
WBC: 8.5 10*3/uL (ref 4.0–10.5)
nRBC: 0 % (ref 0.0–0.2)

## 2022-03-10 LAB — POC COVID19 BINAXNOW: SARS Coronavirus 2 Ag: NEGATIVE

## 2022-03-10 MED ORDER — AZITHROMYCIN 250 MG PO TABS: ORAL_TABLET | ORAL | 0 refills | Status: AC

## 2022-03-10 MED ORDER — PREDNISONE 10 MG PO TABS: 10.0000 mg | ORAL_TABLET | Freq: Every day | ORAL | 0 refills | Status: DC

## 2022-03-10 MED ORDER — ALBUTEROL SULFATE HFA 108 (90 BASE) MCG/ACT IN AERS: 2.0000 | INHALATION_SPRAY | Freq: Four times a day (QID) | RESPIRATORY_TRACT | 2 refills | Status: DC | PRN

## 2022-03-10 NOTE — Patient Instructions (Signed)
How to Use a Metered Dose Inhaler  A metered dose inhaler (MDI) is a handheld device filled with medicine that must be breathed into the lungs (inhaled). The medicine is delivered by pushing down on a metal canister. This releases a preset amount of spray and mist through the mouth and into the lungs. Each MDI canister holds a certain number of doses (puffs). Using a spacer with a metered dose inhaler may be recommended to help get more medicine into the lungs. A spacer is a plastic tube that connects to the MDI on one end and has a mouthpiece on the other end. A spacer holds the medicine in the tube for a short time. This allows more medicine to be inhaled. The MDI can be used to deliver many kinds of inhaled medicines, including: Quick relief or rescue medicines, such as bronchodilators. Controller medicines, such as corticosteroids. What are the risks? If you do not use your inhaler correctly, medicine might not reach your lungs to help you breathe. If you do not have enough strength to push down the canister to make it spray, ask your health care provider for ways to help. The medicine in the MDI may cause side effects, such as: Mouth sores (thrush). Cough. Hoarseness. Shakiness. Headache. Supplies needed: A metered dose inhaler. A spacer, if recommended. How to use a metered dose inhaler without a spacer  Remove the cap from the inhaler. If you are using the inhaler for the first time, shake it for 5 seconds, turn it away from your face, then release 4 puffs into the air. This is called priming. Shake the inhaler for 5 seconds. Position the inhaler so the top of the canister faces up. Put your index finger on the top of the medicine canister. Support the bottom of the inhaler with your thumb. Breathe out normally and as completely as possible, away from the inhaler. Either place the inhaler between your teeth and close your lips tightly around the mouthpiece, or hold the inhaler 1-2  inches (2.5-5 cm) away from your open mouth. Keep your tongue down out of the way. If you are unsure which technique to use, ask your health care provider. Press the canister down with your index finger to release the medicine. Inhale deeply and slowly through your mouth until your lungs are completely filled. Do not breathe in through your nose. Inhaling should take 4-6 seconds. Hold the medicine in your lungs for 5-10 seconds (10 seconds is best). This helps the medicine get into the small airways of your lungs. Remove the inhaler from your mouth, turn your head, and breathe out normally. Wait about 1 minute between puffs or as directed. Then repeat steps 3-10 until you have taken the number of puffs that your health care provider directed. Put the cap on the inhaler. If you are using a steroid inhaler, rinse your mouth with water, gargle, and spit out the water. Do not swallow the water. How to use a metered dose inhaler with a spacer  Remove the cap from the inhaler. If you are using the inhaler for the first time, shake it for 5 seconds, turn it away from your face, then release 4 puffs into the air. This is called priming. Shake the inhaler for 5 seconds. Place the open end of the spacer onto the inhaler mouthpiece. Position the inhaler so the top of the canister faces up and the spacer mouthpiece faces you. Put your index finger on the top of the medicine canister. Support  the bottom of the inhaler and the spacer with your thumb. Breathe out normally and as completely as possible, away from the spacer. Place the spacer between your teeth and close your lips tightly around it. Keep your tongue down out of the way. Press the canister down with your index finger to release the medicine, then inhale deeply and slowly through your mouth until your lungs are completely filled. Do not breathe in through your nose. Inhaling should take 4-6 seconds. Hold the medicine in your lungs for 5-10 seconds  (10 seconds is best). This helps the medicine get into the small airways of your lungs. Remove the spacer from your mouth, turn your head, and breathe out normally. Wait about 1 minute between puffs or as directed. Then repeat steps 3-11 until you have taken the number of puffs that your health care provider directed. Remove the spacer from the inhaler and put the cap on the inhaler. If you are using a steroid inhaler, rinse your mouth with water, gargle, and spit out the water. Do not swallow the water. Follow these instructions at home: Caring for your MDI Store your inhaler at or near room temperature. A cold MDI will not work properly. Follow directions on the package insert for care and cleaning of your MDI and spacer. General instructions Take your inhaled medicine only as told by your health care provider. Do not use the inhaler more than directed by your health care provider. Refill your MDI with medicine before all the preset doses have been used. If your inhaler has a counter, check it to determine how full your MDI is. The number you see tells you how many doses are left. If your inhaler does not have a counter, ask your health care provider when you will need to refill it. Then write the refill date on a calendar or on your MDI canister. Keep in mind that you cannot tell when the medicine in an inhaler is empty by shaking it. You may feel or hear something in the canister even when the preset medicine doses have been used up. Keeping track of your dosages is important. Do not use any products that contain nicotine or tobacco, such as cigarettes, e-cigarettes, and chewing tobacco. If you need help quitting, ask your health care provider. Keep all follow-up visits as told by your health care provider. This is important. Where to find more information Centers for Disease Control and Prevention: http://www.wolf.info/ American Lung Association: www.lung.org Contact a health care provider  if: Symptoms are only partially relieved with your inhaler. You are having trouble using your inhaler. You have side effects from the medicine. You have chills or a fever. You have night sweats. There is blood in your thick saliva (phlegm). Get help right away if: You have dizziness. You have a fast heart rate. You have severe shortness of breath. You have difficulty breathing. These symptoms may represent a serious problem that is an emergency. Do not wait to see if the symptoms will go away. Get medical help right away. Call your local emergency services (911 in the [place] Do not drive yourself to the hospital. Summary A metered dose inhaler is a handheld device for taking medicine that must be breathed into the lungs (inhaled). Take your inhaled medicine only as told by your health care provider. Do not use the inhaler more than directed by your health care provider. You cannot tell when the medicine is gone in an inhaler by shaking it. Refill it with medicine  before all the preset doses have been used. Follow directions on the package insert for care and cleaning of your MDI and spacer. This information is not intended to replace advice given to you by your health care provider. Make sure you discuss any questions you have with your health care provider. Document Revised: 07/15/2019 Document Reviewed: 07/15/2019 Elsevier Patient Education  [place].

## 2022-03-10 NOTE — Progress Notes (Signed)
Date:  03/10/2022   Name:  Pheng Prokop.   DOB:  11-22-1970   MRN:  [id number]   Chief Complaint: Cough (And cong and wheezing, chills- tested negative for COVID 2 days ago)  Cough This is a new problem. The current episode started in the past 7 days (onset monday). The problem has been gradually worsening. The problem occurs every few minutes. The cough is Productive of purulent sputum. Associated symptoms include chills, a fever, nasal congestion, postnasal drip, rhinorrhea and wheezing. Pertinent negatives include no chest pain, sore throat or shortness of breath. The symptoms are aggravated by lying down. Risk factors for lung disease include smoking/tobacco exposure. He has tried nothing (tessalon perles) for the symptoms.    Lab Results  Component Value Date   NA 138 03/06/2022   K 4.5 03/06/2022   CO2 22 03/06/2022   GLUCOSE 97 03/06/2022   BUN 19 03/06/2022   CREATININE 1.02 03/06/2022   CALCIUM 9.2 03/06/2022   EGFR 90 03/06/2022   GFRNONAA >60 02/15/2022   Lab Results  Component Value Date   CHOL 221 (H) 03/06/2022   HDL 31 (L) 03/06/2022   LDLCALC 112 (H) 03/06/2022   TRIG 452 (H) 03/06/2022   No results found for: "TSH" No results found for: "HGBA1C" Lab Results  Component Value Date   WBC 10.6 (H) 02/15/2022   HGB 15.9 02/15/2022   HCT 47.0 02/15/2022   MCV 85.5 02/15/2022   PLT 182 02/15/2022   No results found for: "ALT", "AST", "GGT", "ALKPHOS", "BILITOT" No results found for: "25OHVITD2", "25OHVITD3", "VD25OH"   Review of Systems  Constitutional:  Positive for chills and fever.  HENT:  Positive for postnasal drip and rhinorrhea. Negative for sore throat.   Respiratory:  Positive for cough, chest tightness and wheezing. Negative for shortness of breath.   Cardiovascular:  Negative for chest pain, palpitations and leg swelling.    There are no problems to display for this patient.   Allergies  Allergen Reactions   Tramadol Nausea And  Vomiting    Past Surgical History:  Procedure Laterality Date   APPENDECTOMY     CERVICAL FUSION     C-6 and C-7   EYE SURGERY     R) eye- "growth removed"  L) eye- removed foreign object    Social History   Tobacco Use   Smoking status: Every Day    Packs/day: 1.00    Years: 40.00    Total pack years: 40.00    Types: Cigarettes   Smokeless tobacco: Never   Tobacco comments:    patches and pills discussed  Vaping Use   Vaping Use: Never used  Substance Use Topics   Alcohol use: Yes    Comment: occassionally   Drug use: No     Medication list has been reviewed and updated.  Current Meds  Medication Sig   azelastine (ASTELIN) 0.1 % nasal spray Place into the nose.   benzonatate (TESSALON) 200 MG capsule Take 200 mg by mouth 3 (three) times daily as needed.   fenofibrate (TRICOR) 145 MG tablet Take 1 tablet (145 mg total) by mouth daily.   Multiple Vitamins-Minerals (ONE-A-DAY MENS HEALTH FORMULA PO) Take 1 tablet by mouth daily.   sildenafil (VIAGRA) 50 MG tablet Take 1 tablet (50 mg total) by mouth daily as needed for erectile dysfunction.       03/10/2022   11:05 AM 03/06/2022    9:50 AM 04/12/2021    3:13 PM  GAD 7 : Generalized Anxiety Score  Nervous, Anxious, on Edge 0 0 1  Control/stop worrying 0 0 0  Worry too much - different things 0 0 0  Trouble relaxing 0 0 1  Restless 0 0 3  Easily annoyed or irritable 0 0 3  Afraid - awful might happen 0 0 0  Total GAD 7 Score 0 0 8  Anxiety Difficulty Not difficult at all Not difficult at all Not difficult at all       03/10/2022   11:05 AM 03/06/2022    9:50 AM 04/12/2021    3:13 PM  Depression screen PHQ 2/9  Decreased Interest 0 0 0  Down, Depressed, Hopeless 0 0 0  PHQ - 2 Score 0 0 0  Altered sleeping 0 0 0  Tired, decreased energy 0 0 0  Change in appetite 0 0 0  Feeling bad or failure about yourself  0 0 0  Trouble concentrating 0 0 0  Moving slowly or fidgety/restless 0 0 0  Suicidal thoughts 0  0 0  PHQ-9 Score 0 0 0  Difficult doing work/chores Not difficult at all Not difficult at all     BP Readings from Last 3 Encounters:  03/10/22 120/80  03/06/22 130/80  02/15/22 (!) 165/97    Physical Exam Vitals and nursing note reviewed.  HENT:     Head: Normocephalic.     Right Ear: External ear normal.     Left Ear: External ear normal.     Nose: Congestion and rhinorrhea present.     Mouth/Throat:     Mouth: Mucous membranes are moist.  Eyes:     General: No scleral icterus.       Right eye: No discharge.        Left eye: No discharge.     Conjunctiva/sclera: Conjunctivae normal.     Pupils: Pupils are equal, round, and reactive to light.  Neck:     Thyroid: No thyromegaly.     Vascular: No JVD.     Trachea: No tracheal deviation.  Cardiovascular:     Rate and Rhythm: Normal rate and regular rhythm.     Heart sounds: Normal heart sounds. No murmur heard.    No friction rub. No gallop.  Pulmonary:     Effort: No respiratory distress.     Breath sounds: Examination of the right-upper field reveals wheezing. Examination of the left-upper field reveals wheezing. Examination of the right-middle field reveals wheezing. Examination of the left-middle field reveals wheezing. Examination of the right-lower field reveals wheezing. Examination of the left-lower field reveals wheezing. Wheezing present. No decreased breath sounds, rhonchi or rales.     Comments: Status post nebulization with DuoNeb with auscultation and noting bilateral wheezing without rales but improved air exchange. Abdominal:     General: Bowel sounds are normal.     Palpations: Abdomen is soft. There is no mass.     Tenderness: There is no abdominal tenderness. There is no guarding or rebound.  Musculoskeletal:        General: No tenderness. Normal range of motion.     Cervical back: Normal range of motion and neck supple.  Lymphadenopathy:     Cervical: No cervical adenopathy.  Skin:    General: Skin is  warm.     Findings: No rash.  Neurological:     Mental Status: He is alert.     Wt Readings from Last 3 Encounters:  03/10/22 204 lb (92.5 kg)  03/06/22  205 lb (93 kg)  02/15/22 206 lb (93.4 kg)    BP 120/80   Pulse 84   Temp 98.3 F (36.8 C) (Oral)   Ht 5' 10"  (1.778 m)   Wt 204 lb (92.5 kg)   SpO2 95%   BMI 29.27 kg/m   Assessment and Plan: 1. Acute cough 5 days of acute cough.  Productive of green to yellow sputum.  Associated with fever.  COVID was noted negative we will obtain a chest x-ray to rule out pneumonia.  Nebulization with DuoNeb given there was improvement of the cough but continued having wheezes but air exchange was improved. - POC COVID-19 - DG Chest 2 View  2. Chills Fever and chills at home consistent with the possibility of a community-acquired pneumonia will obtain chest x-ray and will likely initiate prednisone with azithromycin. - POC COVID-19 - DG Chest 2 View  3. Wheezes See above  CBC with differential is in normal range as well as chest x-ray reveals no pneumonia however we will continue with the albuterol prednisone and patient is also being given azithromycin for persistent bronchitis.  Otilio Miu, MD

## 2022-03-17 DIAGNOSIS — B379 Candidiasis, unspecified: Secondary | ICD-10-CM | POA: Diagnosis not present

## 2022-04-03 ENCOUNTER — Telehealth: Payer: Self-pay | Admitting: Gastroenterology

## 2022-04-03 NOTE — Telephone Encounter (Signed)
Patient called needing to reschedule colonoscopy. Patient wants to schedule for after the new year. Please return patients call.

## 2022-04-03 NOTE — Telephone Encounter (Signed)
Returned patients call.  LVM for him to call me back to reschedule for next year.  Thanks,  [place], [place]

## 2022-04-10 ENCOUNTER — Other Ambulatory Visit: Payer: Self-pay | Admitting: Family Medicine

## 2022-04-10 NOTE — Telephone Encounter (Signed)
Medication Refill - Medication: fenofibrate (TRICOR) 145 MG tablet   sildenafil (VIAGRA) 50 MG tablet  Has the patient contacted their pharmacy? yes (Agent: If no, request that the patient contact the pharmacy for the refill. If patient does not wish to contact the pharmacy document the reason why and proceed with request.) (Agent: If yes, when and what did the pharmacy advise?)contact pcp  Preferred Pharmacy (with phone number or street name): [facility] [place], [address] AT [address]  Phone:  [phone] Fax:  [phone] Has the patient been seen for an appointment in the last year OR does the patient have an upcoming appointment? yes  Agent: Please be advised that RX refills may take up to 3 business days. We ask that you follow-up with your pharmacy.

## 2022-04-11 NOTE — Telephone Encounter (Signed)
Requested Prescriptions  Pending Prescriptions Disp Refills  . fenofibrate (TRICOR) 145 MG tablet 30 tablet 1    Sig: Take 1 tablet (145 mg total) by mouth daily.     Cardiovascular:  Antilipid - Fibric Acid Derivatives Failed - 04/10/2022  1:34 PM      Failed - ALT in normal range and within 360 days    No results found for: "ALT", "LABALT", "POCALT"       Failed - AST in normal range and within 360 days    No results found for: "POCAST", "AST"       Failed - Lipid Panel in normal range within the last 12 months    Cholesterol, Total  Date Value Ref Range Status  03/06/2022 221 (H) 100 - 199 mg/dL Final   LDL Chol Calc (NIH)  Date Value Ref Range Status  03/06/2022 112 (H) 0 - 99 mg/dL Final   HDL  Date Value Ref Range Status  03/06/2022 31 (L) >39 mg/dL Final   Triglycerides  Date Value Ref Range Status  03/06/2022 452 (H) 0 - 149 mg/dL Final         Passed - Cr in normal range and within 360 days    Creatinine  Date Value Ref Range Status  03/22/2014 1.18 0.60 - 1.30 mg/dL Final   Creatinine, Ser  Date Value Ref Range Status  03/06/2022 1.02 0.76 - 1.27 mg/dL Final         Passed - HGB in normal range and within 360 days    Hemoglobin  Date Value Ref Range Status  03/10/2022 15.7 13.0 - 17.0 g/dL Final   HGB  Date Value Ref Range Status  03/22/2014 15.0 13.0 - 18.0 g/dL Final         Passed - HCT in normal range and within 360 days    HCT  Date Value Ref Range Status  03/10/2022 46.8 39.0 - 52.0 % Final  03/22/2014 44.5 40.0 - 52.0 % Final         Passed - PLT in normal range and within 360 days    Platelets  Date Value Ref Range Status  03/10/2022 187 150 - 400 K/uL Final   Platelet  Date Value Ref Range Status  03/22/2014 134 (L) 150 - 440 x10 3/mm 3 Final         Passed - WBC in normal range and within 360 days    WBC  Date Value Ref Range Status  03/10/2022 8.5 4.0 - 10.5 K/uL Final         Passed - eGFR is 30 or above and within 360  days    EGFR (African American)  Date Value Ref Range Status  03/22/2014 >60 >46m/min Final   EGFR (Non-African Amer.)  Date Value Ref Range Status  03/22/2014 >60 >648mmin Final    Comment:    eGFR values <6060min/1.73 m2 may be an indication of chronic kidney disease (CKD). Calculated eGFR, using the MRDR Study equation, is useful in  patients with stable renal function. The eGFR calculation will not be reliable in acutely ill patients when serum creatinine is changing rapidly. It is not useful in patients on dialysis. The eGFR calculation may not be applicable to patients at the low and high extremes of body sizes, pregnant women, and vegetarians.    GFR, Estimated  Date Value Ref Range Status  02/15/2022 >60 >60 mL/min Final    Comment:    (NOTE) Calculated using the CKD-EPI Creatinine Equation (  2021)    eGFR  Date Value Ref Range Status  03/06/2022 90 >59 mL/min/1.73 Final         Passed - Valid encounter within last 12 months    Recent Outpatient Visits          1 month ago Acute cough   [facility] [place], Deanna C, MD   1 month ago Annual physical exam   [place] [facility] [place], Deanna C, MD   12 months ago Establishing care with new doctor, encounter for   [facility] at [place], Deanna C, MD   4 years ago Establishing care with new doctor, encounter for   [place] [facility] at [facility], MD      Future Appointments            In 2 weeks Juline Patch, MD [place] and [facility] at [facility], [place]   In 11 months Juline Patch, MD [place] and Sports Medicine at [facility], [facility]

## 2022-04-20 DIAGNOSIS — M7711 Lateral epicondylitis, right elbow: Secondary | ICD-10-CM | POA: Diagnosis not present

## 2022-04-21 ENCOUNTER — Encounter: Payer: Self-pay | Admitting: Anesthesiology

## 2022-04-21 ENCOUNTER — Ambulatory Visit
Admission: RE | Admit: 2022-04-21 | Payer: BC Managed Care – PPO | Source: Home / Self Care | Admitting: Gastroenterology

## 2022-04-21 ENCOUNTER — Encounter: Admission: RE | Payer: Self-pay | Source: Home / Self Care

## 2022-04-21 SURGERY — COLONOSCOPY WITH PROPOFOL
Anesthesia: General

## 2022-04-25 ENCOUNTER — Ambulatory Visit: Payer: BC Managed Care – PPO | Admitting: Family Medicine

## 2022-05-08 ENCOUNTER — Other Ambulatory Visit: Payer: Self-pay

## 2022-05-08 MED ORDER — FENOFIBRATE 145 MG PO TABS: 145.0000 mg | ORAL_TABLET | Freq: Every day | ORAL | 1 refills | Status: DC

## 2022-05-19 ENCOUNTER — Ambulatory Visit: Payer: BC Managed Care – PPO | Admitting: Family Medicine

## 2022-06-16 ENCOUNTER — Encounter: Payer: Self-pay | Admitting: Family Medicine

## 2022-06-16 ENCOUNTER — Ambulatory Visit: Payer: BC Managed Care – PPO | Admitting: Family Medicine

## 2022-06-16 ENCOUNTER — Other Ambulatory Visit
Admission: RE | Admit: 2022-06-16 | Discharge: 2022-06-16 | Disposition: A | Discharge status: Home / Self Care | Payer: BC Managed Care – PPO | Attending: Family Medicine | Admitting: Family Medicine

## 2022-06-16 VITALS — BP 120/78 | HR 74 | Ht 70.0 in | Wt 207.0 lb

## 2022-06-16 DIAGNOSIS — E785 Hyperlipidemia, unspecified: Secondary | ICD-10-CM | POA: Diagnosis not present

## 2022-06-16 DIAGNOSIS — R21 Rash and other nonspecific skin eruption: Secondary | ICD-10-CM | POA: Diagnosis not present

## 2022-06-16 DIAGNOSIS — K1379 Other lesions of oral mucosa: Secondary | ICD-10-CM

## 2022-06-16 DIAGNOSIS — M7711 Lateral epicondylitis, right elbow: Secondary | ICD-10-CM

## 2022-06-16 DIAGNOSIS — Z122 Encounter for screening for malignant neoplasm of respiratory organs: Secondary | ICD-10-CM

## 2022-06-16 DIAGNOSIS — B079 Viral wart, unspecified: Secondary | ICD-10-CM

## 2022-06-16 LAB — LIPID PANEL
Cholesterol: 240 mg/dL — ABNORMAL HIGH (ref 0–200)
HDL: 32 mg/dL — ABNORMAL LOW (ref >40)
LDL Cholesterol: 167 mg/dL — ABNORMAL HIGH (ref 0–99)
Total CHOL/HDL Ratio: 7.5 RATIO
Triglycerides: 204 mg/dL — ABNORMAL HIGH (ref <150)
VLDL: 41 mg/dL — ABNORMAL HIGH (ref 0–40)

## 2022-06-16 NOTE — Patient Instructions (Signed)

## 2022-06-16 NOTE — Progress Notes (Signed)
Date:  06/16/2022   Name:  Chris Spence.   DOB:  Oct 01, 1970   MRN:  [id number]   Chief Complaint: Rash (Little red dots that are somewhat raised spreading over body- started on hand) and Mouth Lesions (Roof of mouth is hard, been there for a good while, but is getting bigger. Place on lip "swells and then goes down, but biting it is painful")  HPI  Lab Results  Component Value Date   NA 138 03/06/2022   K 4.5 03/06/2022   CO2 22 03/06/2022   GLUCOSE 97 03/06/2022   BUN 19 03/06/2022   CREATININE 1.02 03/06/2022   CALCIUM 9.2 03/06/2022   EGFR 90 03/06/2022   GFRNONAA >60 02/15/2022   Lab Results  Component Value Date   CHOL 221 (H) 03/06/2022   HDL 31 (L) 03/06/2022   LDLCALC 112 (H) 03/06/2022   TRIG 452 (H) 03/06/2022   No results found for: "TSH" No results found for: "HGBA1C" Lab Results  Component Value Date   WBC 8.5 03/10/2022   HGB 15.7 03/10/2022   HCT 46.8 03/10/2022   MCV 84.2 03/10/2022   PLT 187 03/10/2022   No results found for: "ALT", "AST", "GGT", "ALKPHOS", "BILITOT" No results found for: "25OHVITD2", "25OHVITD3", "VD25OH"   Review of Systems  Constitutional:  Negative for chills and fever.  HENT:  Negative for drooling, ear discharge, ear pain and sore throat.   Respiratory:  Negative for cough, shortness of breath and wheezing.   Cardiovascular:  Negative for chest pain, palpitations and leg swelling.  Gastrointestinal:  Negative for abdominal pain, blood in stool, constipation, diarrhea and nausea.  Endocrine: Negative for polydipsia.  Genitourinary:  Negative for dysuria, frequency, hematuria and urgency.  Musculoskeletal:  Negative for back pain, myalgias and neck pain.  Skin:  Negative for rash.  Allergic/Immunologic: Negative for environmental allergies.  Neurological:  Negative for dizziness and headaches.  Hematological:  Does not bruise/bleed easily.  Psychiatric/Behavioral:  Negative for suicidal ideas. The patient is not  nervous/anxious.     There are no problems to display for this patient.   Allergies  Allergen Reactions   Tramadol Nausea And Vomiting    Past Surgical History:  Procedure Laterality Date   APPENDECTOMY     CERVICAL FUSION     C-6 and C-7   EYE SURGERY     R) eye- "growth removed"  L) eye- removed foreign object    Social History   Tobacco Use   Smoking status: Every Day    Packs/day: 1.00    Years: 40.00    Total pack years: 40.00    Types: Cigarettes   Smokeless tobacco: Never   Tobacco comments:    patches and pills discussed  Vaping Use   Vaping Use: Never used  Substance Use Topics   Alcohol use: Yes    Comment: occassionally   Drug use: No     Medication list has been reviewed and updated.  Current Meds  Medication Sig   fenofibrate (TRICOR) 145 MG tablet Take 1 tablet (145 mg total) by mouth daily.   Multiple Vitamins-Minerals (ONE-A-DAY MENS HEALTH FORMULA PO) Take 1 tablet by mouth daily.   sildenafil (VIAGRA) 50 MG tablet Take 1 tablet (50 mg total) by mouth daily as needed for erectile dysfunction.   [DISCONTINUED] azelastine (ASTELIN) 0.1 % nasal spray Place into the nose.       06/16/2022    8:20 AM 03/10/2022   11:05 AM 03/06/2022  9:50 AM 04/12/2021    3:13 PM  GAD 7 : Generalized Anxiety Score  Nervous, Anxious, on Edge 0 0 0 1  Control/stop worrying 0 0 0 0  Worry too much - different things 0 0 0 0  Trouble relaxing 0 0 0 1  Restless 0 0 0 3  Easily annoyed or irritable 0 0 0 3  Afraid - awful might happen 0 0 0 0  Total GAD 7 Score 0 0 0 8  Anxiety Difficulty Not difficult at all Not difficult at all Not difficult at all Not difficult at all       06/16/2022    8:19 AM 03/10/2022   11:05 AM 03/06/2022    9:50 AM  Depression screen PHQ 2/9  Decreased Interest 0 0 0  Down, Depressed, Hopeless 0 0 0  PHQ - 2 Score 0 0 0  Altered sleeping 0 0 0  Tired, decreased energy 0 0 0  Change in appetite 0 0 0  Feeling bad or failure about  yourself  0 0 0  Trouble concentrating 0 0 0  Moving slowly or fidgety/restless 0 0 0  Suicidal thoughts 0 0 0  PHQ-9 Score 0 0 0  Difficult doing work/chores Not difficult at all Not difficult at all Not difficult at all    BP Readings from Last 3 Encounters:  06/16/22 120/78  03/10/22 120/80  03/06/22 130/80    Physical Exam Vitals and nursing note reviewed.  HENT:     Head: Normocephalic.     Right Ear: Tympanic membrane and external ear normal.     Left Ear: Tympanic membrane and external ear normal.     Nose: Nose normal.     Mouth/Throat:     Lips: Lesions present.     Mouth: Mucous membranes are moist.     Palate: Lesions present.     Pharynx: Oropharynx is clear.     Comments: Bullous lesion/palate  wart like mucus aspect lower lip Eyes:     General: No scleral icterus.       Right eye: No discharge.        Left eye: No discharge.     Conjunctiva/sclera: Conjunctivae normal.     Pupils: Pupils are equal, round, and reactive to light.  Neck:     Thyroid: No thyromegaly.     Vascular: No JVD.     Trachea: No tracheal deviation.  Cardiovascular:     Rate and Rhythm: Normal rate and regular rhythm.     Heart sounds: Normal heart sounds. No murmur heard.    No friction rub. No gallop.  Pulmonary:     Effort: No respiratory distress.     Breath sounds: Normal breath sounds. No wheezing or rales.  Abdominal:     General: Bowel sounds are normal.     Palpations: Abdomen is soft. There is no mass.     Tenderness: There is no abdominal tenderness. There is no guarding or rebound.  Musculoskeletal:        General: Normal range of motion.     Left elbow: Tenderness present in lateral epicondyle.     Cervical back: Normal range of motion and neck supple.  Lymphadenopathy:     Cervical: No cervical adenopathy.  Skin:    General: Skin is warm.     Findings: No rash.  Neurological:     Mental Status: He is alert.     Wt Readings from Last 3 Encounters:  06/16/22  207 lb (93.9 kg)  03/10/22 204 lb (92.5 kg)  03/06/22 205 lb (93 kg)    BP 120/78   Pulse 74   Ht '5\' 10"'$  (1.778 m)   Wt 207 lb (93.9 kg)   SpO2 95%   BMI 29.70 kg/m   Assessment and Plan:  1. Pityriasis rosea-like skin eruption New onset.  Gradually resolving.  Noted about 3 weeks ago with no prior large patch however there is minimal itching associated with this this has an appearance of PR and we will continue to observe and if has not resolved neck step will be dermatology.  2. Lateral epicondylitis of right elbow Chronic.  Recurrent.  Currently symptomatic.  With tenderness of the lateral epicondyle.  3 previous injections have gradually waned on their effectiveness.  We will refer for orthopedics to evaluate for possible surgical approach or other means such as platelet therapy. - Ambulatory referral to Orthopedic Surgery  3. Viral wart of lip New onset of a lesion on the mucosal aspect of the left lower lip.  If it is accidentally traumatized it is painful has a wartlike appearance we will refer to oral surgery for excision. - Ambulatory referral to Oral Maxillofacial Surgery  4. Lesion of hard palate Patient has noted a place on his upper palate for an extended period of time however it is firm and it seems to be getting larger to the patient and is concerning him.  It does not have a torus appearance that looks more bullous and we will refer to oral surgery for evaluation. - Ambulatory referral to Oral Maxillofacial Surgery  5. Hyperlipidemia, unspecified hyperlipidemia type Chronic.  Diet controlled.  Relatively stable.  We will check lipid panel for current level of control.  6. Screening for lung cancer Patient is a 40-pack-year history and is in an age that we may screen for pulmonary nodule/lung cancer.  We will place in consideration for low-dose CT chest scan.   Otilio Miu, MD

## 2022-06-19 ENCOUNTER — Other Ambulatory Visit: Payer: Self-pay

## 2022-06-19 DIAGNOSIS — E785 Hyperlipidemia, unspecified: Secondary | ICD-10-CM

## 2022-06-19 MED ORDER — ATORVASTATIN CALCIUM 10 MG PO TABS: 10.0000 mg | ORAL_TABLET | Freq: Every day | ORAL | 1 refills | Status: DC

## 2022-07-17 ENCOUNTER — Telehealth: Payer: Self-pay | Admitting: Family Medicine

## 2022-07-17 NOTE — Telephone Encounter (Signed)
Copied from [place] [phone] [phone]. Topic: Referral - Status >> Jul 17, 2022  9:44 AM Cyndi Bender wrote: Reason for CRM: Pt stated the oral surgeon that he was referred to is out of network so he would like to request referral to [facility] fax# [phone]

## 2022-07-17 NOTE — Telephone Encounter (Signed)
Alexa from [facility] called in states they can't accept referral from primary. The can referral only from dentist who  has to see first then go from there for referral to them .

## 2022-07-31 ENCOUNTER — Telehealth: Payer: Self-pay | Admitting: Family Medicine

## 2022-07-31 NOTE — Telephone Encounter (Signed)
Copied from [place] [id number]. Topic: General - Other >> Jul 31, 2022  4:20 PM Chapman Fitch wrote: Reason for CRM: Pt stated he has been waiting to hear something about the Chest scan he needed / please advise asap

## 2022-08-03 ENCOUNTER — Other Ambulatory Visit: Payer: Self-pay

## 2022-08-03 MED ORDER — FENOFIBRATE 145 MG PO TABS: 145.0000 mg | ORAL_TABLET | Freq: Every day | ORAL | 1 refills | Status: DC

## 2022-08-04 ENCOUNTER — Other Ambulatory Visit: Payer: Self-pay

## 2022-08-04 DIAGNOSIS — Z87891 Personal history of nicotine dependence: Secondary | ICD-10-CM

## 2022-08-04 DIAGNOSIS — F17210 Nicotine dependence, cigarettes, uncomplicated: Secondary | ICD-10-CM

## 2022-08-23 ENCOUNTER — Ambulatory Visit (INDEPENDENT_AMBULATORY_CARE_PROVIDER_SITE_OTHER): Payer: BC Managed Care – PPO | Admitting: Acute Care

## 2022-08-23 ENCOUNTER — Encounter: Payer: Self-pay | Admitting: Acute Care

## 2022-08-23 DIAGNOSIS — F17210 Nicotine dependence, cigarettes, uncomplicated: Secondary | ICD-10-CM

## 2022-08-23 NOTE — Patient Instructions (Signed)
Thank you for participating in the [facility]. It was our pleasure to meet you today. We will call you with the results of your scan within the next few days. Your scan will be assigned a Lung RADS category score by the physicians reading the scans.  This Lung RADS score determines follow up scanning.  See below for description of categories, and follow up screening recommendations. We will be in touch to schedule your follow up screening annually or based on recommendations of our providers. We will fax a copy of your scan results to your Primary Care Physician, or the physician who referred you to the program, to ensure they have the results. Please call the office if you have any questions or concerns regarding your scanning experience or results.  Our office number is [phone]. Please speak with Denise Phelps, RN. , or  Denise Buckner RN, They are  our Lung Cancer Screening RN.'s If They are unavailable when you call, Please leave a message on the voice mail. We will return your call at our earliest convenience.This voice mail is monitored several times a day.  Remember, if your scan is normal, we will scan you annually as long as you continue to meet the criteria for the program. (Age 50-80, Current smoker or smoker who has quit within the last 15 years). If you are a smoker, remember, quitting is the single most powerful action that you can take to decrease your risk of lung cancer and other pulmonary, breathing related problems. We know quitting is hard, and we are here to help.  Please let us know if there is anything we can do to help you meet your goal of quitting. If you are a former smoker, congratulations. We are proud of you! Remain smoke free! Remember you can refer friends or family members through the number above.  We will screen them to make sure they meet criteria for the program. Thank you for helping us take better care of you by  participating in Lung Screening.  You can receive free nicotine replacement therapy ( patches, gum or mints) by calling 1-800-QUIT NOW. Please call so we can get you on the path to becoming  a non-smoker. I know it is hard, but you can do this!  Lung RADS Categories:  Lung RADS 1: no nodules or definitely non-concerning nodules.  Recommendation is for a repeat annual scan in 12 months.  Lung RADS 2:  nodules that are non-concerning in appearance and behavior with a very low likelihood of becoming an active cancer. Recommendation is for a repeat annual scan in 12 months.  Lung RADS 3: nodules that are probably non-concerning , includes nodules with a low likelihood of becoming an active cancer.  Recommendation is for a 6-month repeat screening scan. Often noted after an upper respiratory illness. We will be in touch to make sure you have no questions, and to schedule your 6-month scan.  Lung RADS 4 A: nodules with concerning findings, recommendation is most often for a follow up scan in 3 months or additional testing based on our provider's assessment of the scan. We will be in touch to make sure you have no questions and to schedule the recommended 3 month follow up scan.  Lung RADS 4 B:  indicates findings that are concerning. We will be in touch with you to schedule additional diagnostic testing based on our provider's  assessment of the scan.  Other options for assistance in smoking cessation (   As covered by your insurance benefits)  Hypnosis for smoking cessation  [employer] [phone]  Acupuncture for smoking cessation  [facility] [phone]   

## 2022-08-23 NOTE — Progress Notes (Signed)
Virtual Visit via Telephone Note  I connected with Chris Spence. on 08/23/22 at  4:00 PM EDT by telephone and verified that I am speaking with the correct person using two identifiers.  Location: Patient:  At home Provider: [place], [place], [place], [address]    I discussed the limitations, risks, security and privacy concerns of performing an evaluation and management service by telephone and the availability of in person appointments. I also discussed with the patient that there may be a patient responsible charge related to this service. The patient expressed understanding and agreed to proceed.   Shared Decision Making Visit Lung Cancer Screening Program [phone])   Eligibility: Age 52 y.o. Pack Years Smoking History Calculation 39 pack year smoking history (# packs/per year x # years smoked) Recent History of coughing up blood  no Unexplained weight loss? no ( >Than 15 pounds within the last 6 months ) Prior History Lung / other cancer no (Diagnosis within the last 5 years already requiring surveillance chest CT Scans). Smoking Status Current Smoker Former Smokers: Years since quit:  NA  Quit Date:  NA  Visit Components: Discussion included one or more decision making aids. yes Discussion included risk/benefits of screening. yes Discussion included potential follow up diagnostic testing for abnormal scans. yes Discussion included meaning and risk of over diagnosis. yes Discussion included meaning and risk of False Positives. yes Discussion included meaning of total radiation exposure. yes  Counseling Included: Importance of adherence to annual lung cancer LDCT screening. yes Impact of comorbidities on ability to participate in the program. yes Ability and willingness to under diagnostic treatment. yes  Smoking Cessation Counseling: Current Smokers:  Discussed importance of smoking cessation. yes Information about tobacco cessation classes and  interventions provided to patient. yes Patient provided with "ticket" for LDCT Scan. yes Symptomatic Patient. no  Counseling NA Diagnosis Code: Tobacco Use Z72.0 Asymptomatic Patient yes  Counseling (Intermediate counseling: > three minutes counseling) [id number] Former Smokers:  Discussed the importance of maintaining cigarette abstinence. yes Diagnosis Code: Personal History of Nicotine Dependence. [id number] Information about tobacco cessation classes and interventions provided to patient. Yes Patient provided with "ticket" for LDCT Scan. yes Written Order for Lung Cancer Screening with LDCT placed in Epic. Yes (CT Chest Lung Cancer Screening Low Dose W/O CM) [id number] Z12.2-Screening of respiratory organs Z87.891-Personal history of nicotine dependence  I have spent 25 minutes of face to face/ virtual visit   time with  Chris Spence discussing the risks and benefits of lung cancer screening. We viewed / discussed a power point together that explained in detail the above noted topics. We paused at intervals to allow for questions to be asked and answered to ensure understanding.We discussed that the single most powerful action that he can take to decrease his risk of developing lung cancer is to quit smoking. We discussed whether or not he is ready to commit to setting a quit date. We discussed options for tools to aid in quitting smoking including nicotine replacement therapy, non-nicotine medications, support groups, Quit Smart classes, and behavior modification. We discussed that often times setting smaller, more achievable goals, such as eliminating 1 cigarette a day for a week and then 2 cigarettes a day for a week can be helpful in slowly decreasing the number of cigarettes smoked. This allows for a sense of accomplishment as well as providing a clinical benefit. I provided  him  with smoking cessation  information  with contact information for community resources,  classes, free nicotine replacement  therapy, and access to mobile apps, text messaging, and on-line smoking cessation help. I have also provided  him  the office contact information in the event he needs to contact me, or the screening staff. We discussed the time and location of the scan, and that either Doroteo Glassman RN, Joella Prince, RN  or I will call / send a letter with the results within 24-72 hours of receiving them. The patient verbalized understanding of all of  the above and had no further questions upon leaving the office. They have my contact information in the event they have any further questions.  I spent 3 minutes counseling on smoking cessation and the health risks of continued tobacco abuse.  I explained to the patient that there has been a high incidence of coronary artery disease noted on these exams. I explained that this is a non-gated exam therefore degree or severity cannot be determined. This patient is on statin therapy. I have asked the patient to follow-up with their PCP regarding any incidental finding of coronary artery disease and management with diet or medication as their PCP  feels is clinically indicated. The patient verbalized understanding of the above and had no further questions upon completion of the visit.      Magdalen Spatz, NP 08/23/2022

## 2022-08-24 ENCOUNTER — Ambulatory Visit
Admission: RE | Admit: 2022-08-24 | Discharge: 2022-08-24 | Disposition: A | Discharge status: Home / Self Care | Payer: BC Managed Care – PPO | Source: Ambulatory Visit | Attending: Acute Care | Admitting: Acute Care

## 2022-08-24 DIAGNOSIS — F17210 Nicotine dependence, cigarettes, uncomplicated: Secondary | ICD-10-CM | POA: Diagnosis not present

## 2022-08-24 DIAGNOSIS — Z87891 Personal history of nicotine dependence: Secondary | ICD-10-CM

## 2022-08-29 ENCOUNTER — Other Ambulatory Visit: Payer: Self-pay | Admitting: Acute Care

## 2022-08-29 DIAGNOSIS — Z122 Encounter for screening for malignant neoplasm of respiratory organs: Secondary | ICD-10-CM

## 2022-08-29 DIAGNOSIS — F17210 Nicotine dependence, cigarettes, uncomplicated: Secondary | ICD-10-CM

## 2022-08-29 DIAGNOSIS — Z87891 Personal history of nicotine dependence: Secondary | ICD-10-CM

## 2022-11-22 IMAGING — CR DG CHEST 2V
2 series · 2 of 2 positions shown · non-contrast
Comparison: Chest radiographs 06/15/2020.

CLINICAL DATA: 50-year-old male with left upper back and shoulder
pain since yesterday, exacerbated by movement.

EXAM:
CHEST - 2 VIEW

[chest pa]
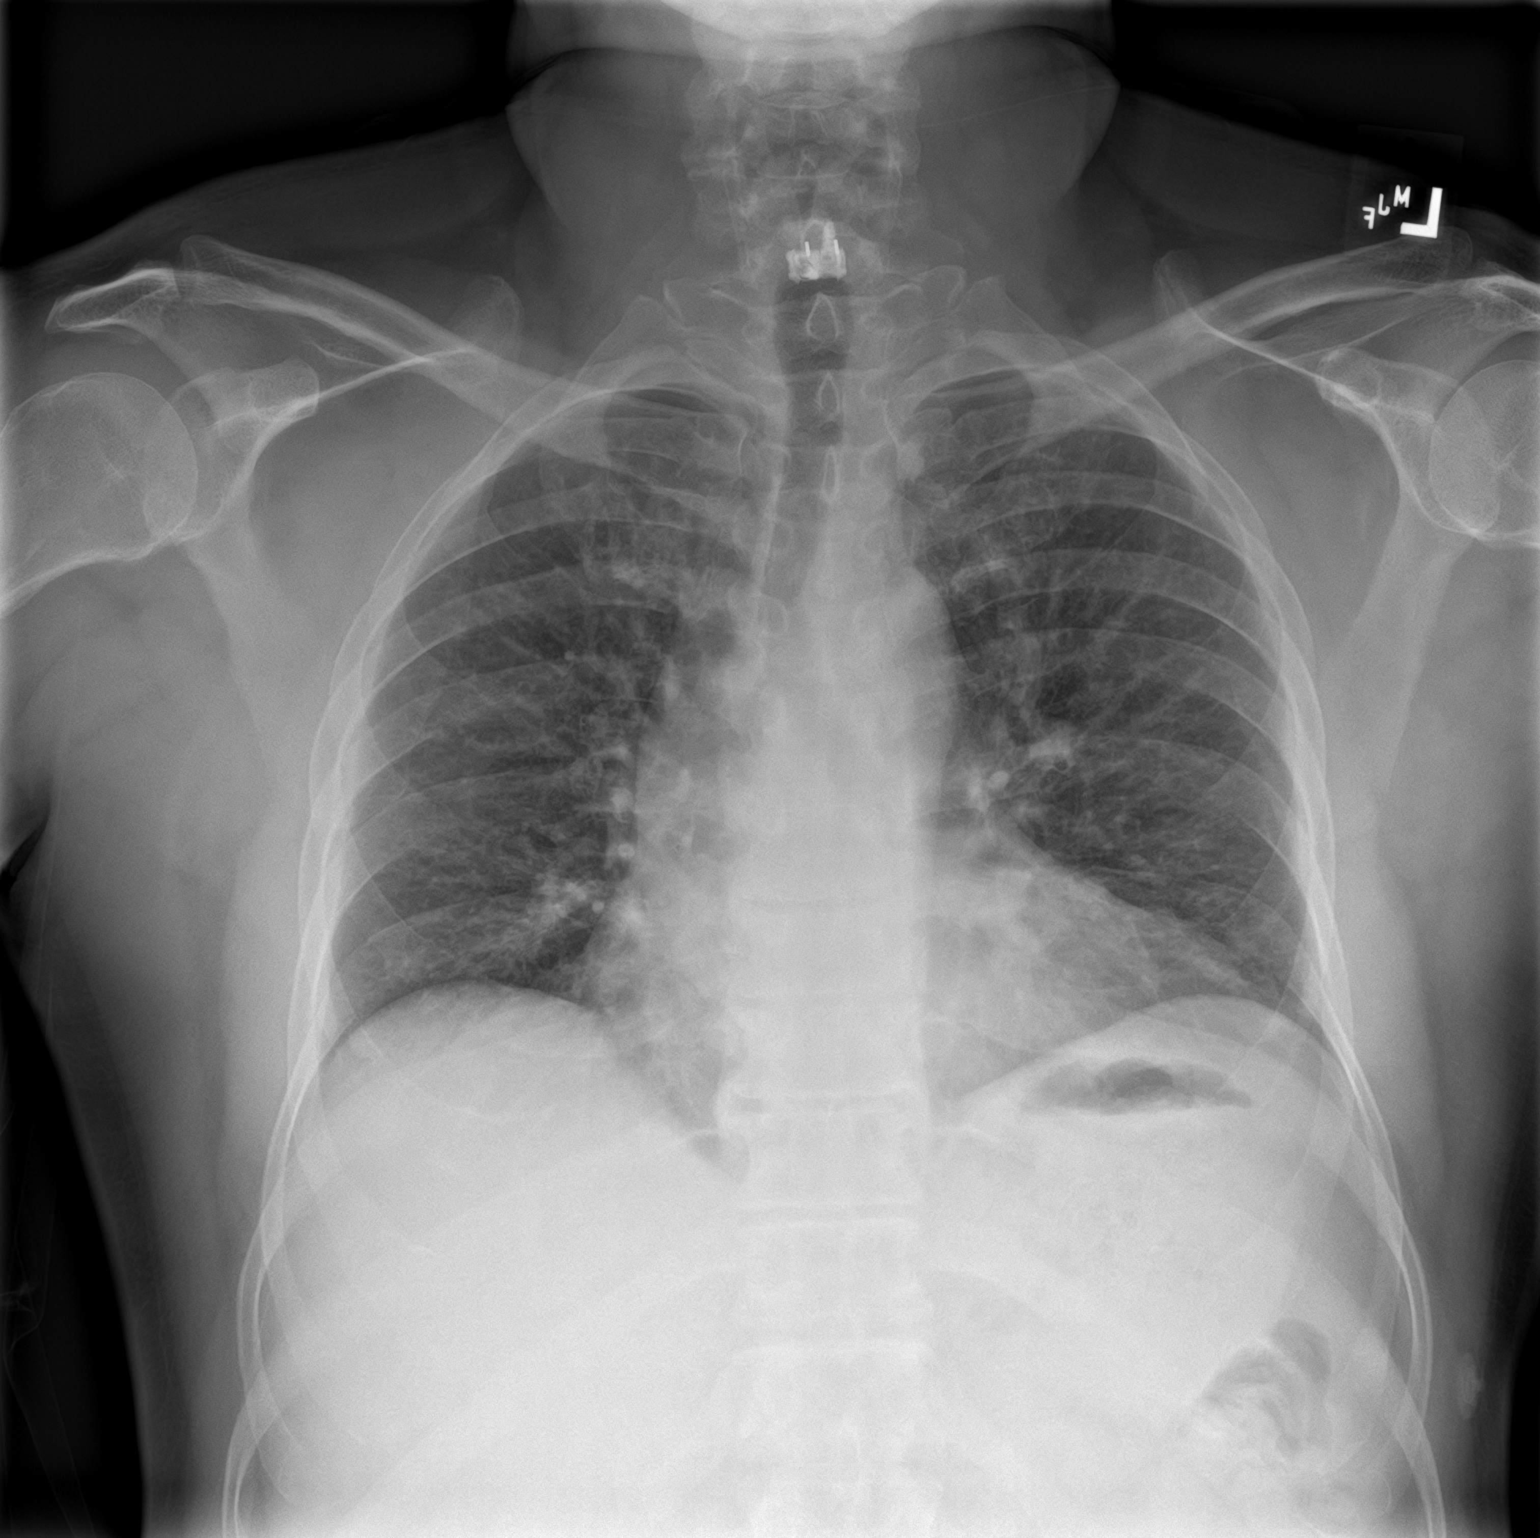

[chest lat]
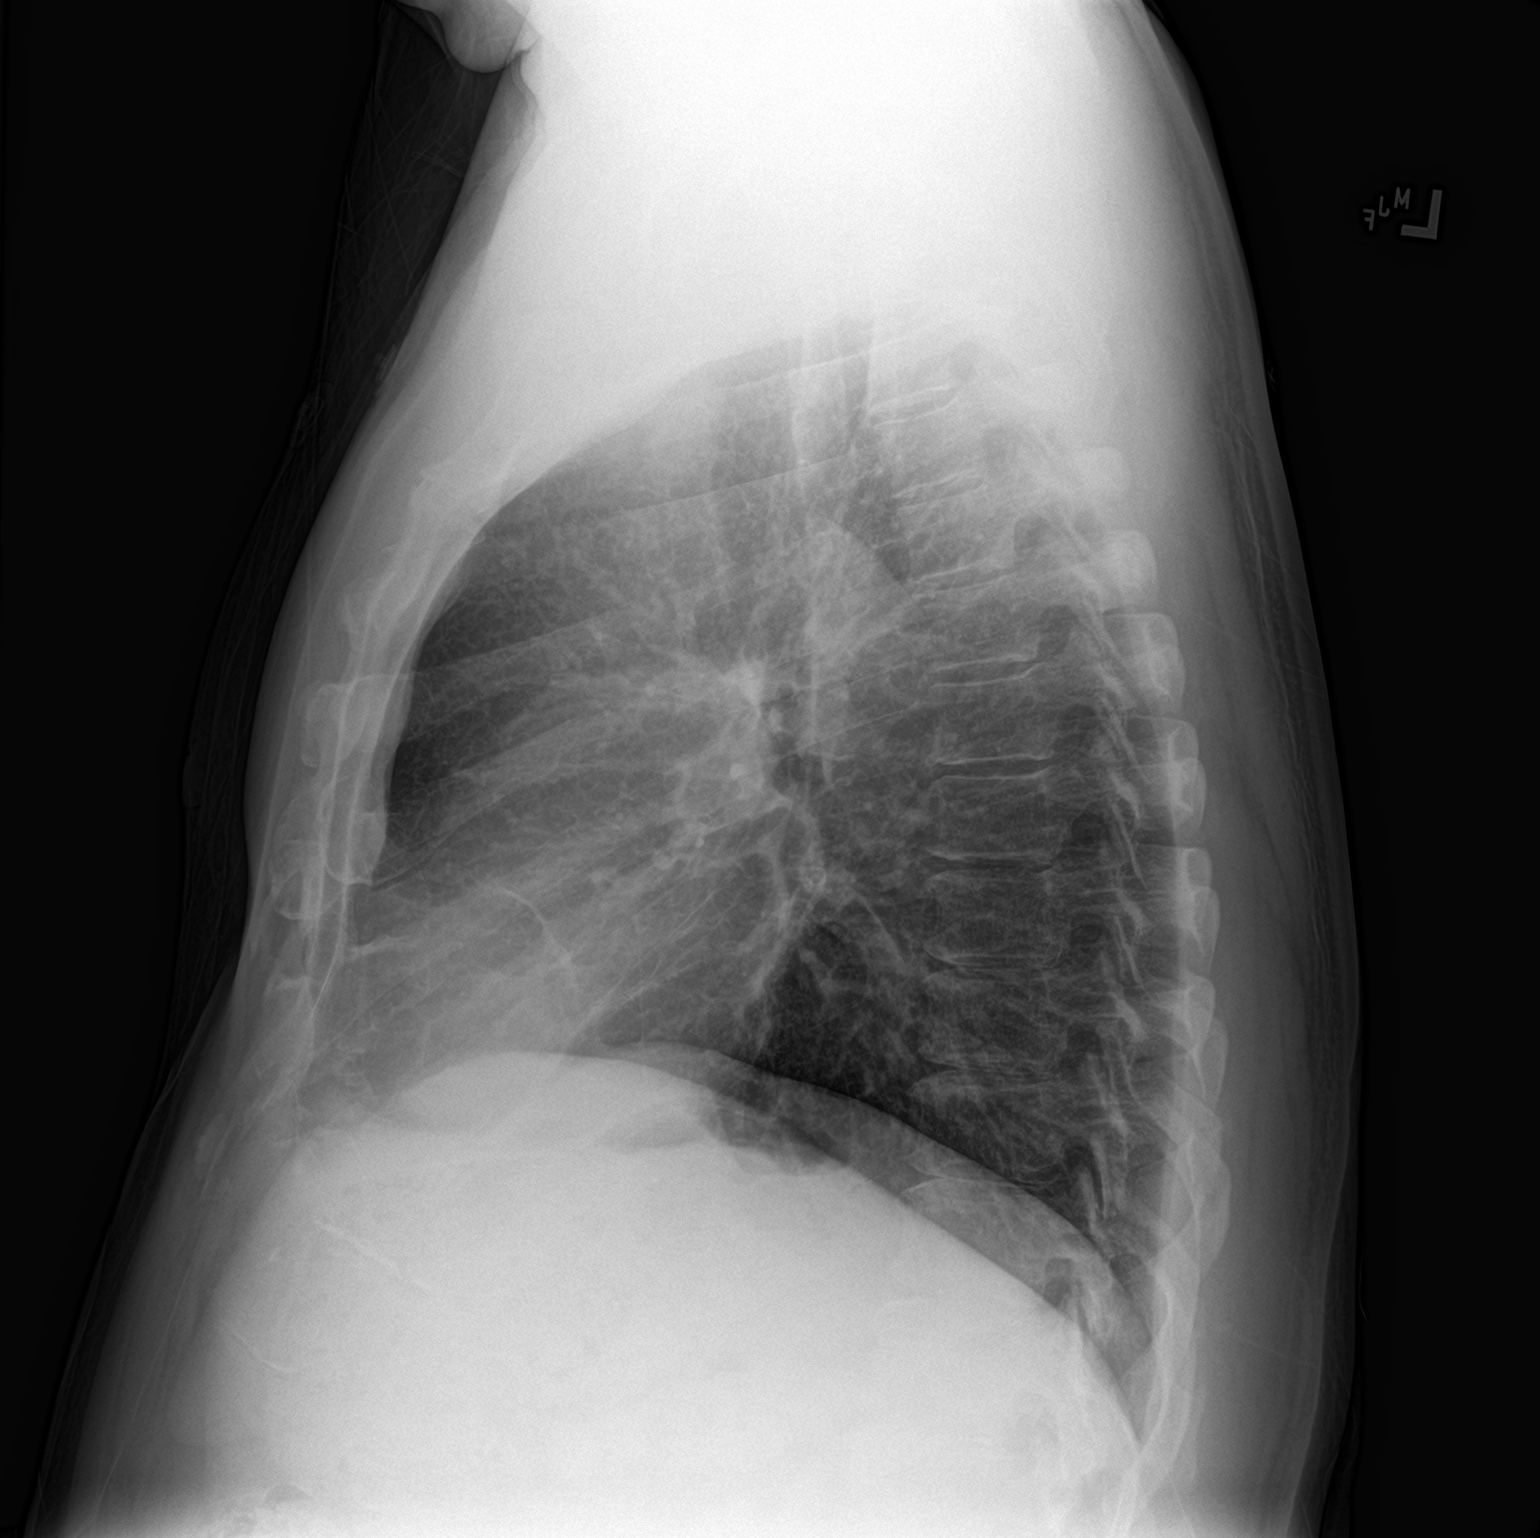

[2 of 2 positions shown; findings below may reference images not displayed]

FINDINGS: Lower lung volumes on both views. Mediastinal contours remain within
normal limits. Visualized tracheal air column is within normal
limits. Mild crowding of lung markings with no pneumothorax, pleural
effusion, pulmonary edema, or confluent lung opacity.

Negative visible bowel gas. Prior lower cervical ACDF. No thoracic
compression fracture identified. No acute osseous abnormality
identified.
IMPRESSION: Lower lung volumes. No acute cardiopulmonary abnormality.

## 2022-11-29 ENCOUNTER — Other Ambulatory Visit: Payer: Self-pay | Admitting: Family Medicine

## 2022-11-29 NOTE — Telephone Encounter (Signed)
Requested Prescriptions  Pending Prescriptions Disp Refills   fenofibrate (TRICOR) 145 MG tablet [Pharmacy Med Name: FENOFIBRATE 145MG  TABLETS] 30 tablet 1    Sig: TAKE 1 TABLET(145 MG) BY MOUTH DAILY     Cardiovascular:  Antilipid - Fibric Acid Derivatives Failed - 11/29/2022  8:05 AM      Failed - ALT in normal range and within 360 days    No results found for: "ALT", "LABALT", "POCALT"       Failed - AST in normal range and within 360 days    No results found for: "POCAST", "AST"       Failed - Lipid Panel in normal range within the last 12 months    Cholesterol, Total  Date Value Ref Range Status  03/06/2022 221 (H) 100 - 199 mg/dL Final   Cholesterol  Date Value Ref Range Status  06/16/2022 240 (H) 0 - 200 mg/dL Final   LDL Chol Calc (NIH)  Date Value Ref Range Status  03/06/2022 112 (H) 0 - 99 mg/dL Final   LDL Cholesterol  Date Value Ref Range Status  06/16/2022 167 (H) 0 - 99 mg/dL Final    Comment:           Total Cholesterol/HDL:CHD Risk Coronary Heart Disease Risk Table                     Men   Women  1/2 Average Risk   3.4   3.3  Average Risk       5.0   4.4  2 X Average Risk   9.6   7.1  3 X Average Risk  23.4   11.0        Use the calculated Patient Ratio above and the CHD Risk Table to determine the patient's CHD Risk.        ATP III CLASSIFICATION (LDL):  <100     mg/dL   Optimal  161-096  mg/dL   Near or Above                    Optimal  130-159  mg/dL   Borderline  045-409  mg/dL   High  >811     mg/dL   Very High Performed at [facility], [address] [place], [place] [address]    HDL  Date Value Ref Range Status  06/16/2022 32 (L) >40 mg/dL Final  29/56/2130 31 (L) >39 mg/dL Final   Triglycerides  Date Value Ref Range Status  06/16/2022 204 (H) <150 mg/dL Final         Passed - Cr in normal range and within 360 days    Creatinine  Date Value Ref Range Status  03/22/2014 1.18 0.60 - 1.30 mg/dL Final   Creatinine,  Ser  Date Value Ref Range Status  03/06/2022 1.02 0.76 - 1.27 mg/dL Final         Passed - HGB in normal range and within 360 days    Hemoglobin  Date Value Ref Range Status  03/10/2022 15.7 13.0 - 17.0 g/dL Final   HGB  Date Value Ref Range Status  03/22/2014 15.0 13.0 - 18.0 g/dL Final         Passed - HCT in normal range and within 360 days    HCT  Date Value Ref Range Status  03/10/2022 46.8 39.0 - 52.0 % Final  03/22/2014 44.5 40.0 - 52.0 % Final         Passed -  PLT in normal range and within 360 days    Platelets  Date Value Ref Range Status  03/10/2022 187 150 - 400 K/uL Final   Platelet  Date Value Ref Range Status  03/22/2014 134 (L) 150 - 440 x10 3/mm 3 Final         Passed - WBC in normal range and within 360 days    WBC  Date Value Ref Range Status  03/10/2022 8.5 4.0 - 10.5 K/uL Final         Passed - eGFR is 30 or above and within 360 days    EGFR (African American)  Date Value Ref Range Status  03/22/2014 >60 >17mL/min Final   EGFR (Non-African Amer.)  Date Value Ref Range Status  03/22/2014 >60 >7mL/min Final    Comment:    eGFR values <35mL/min/1.73 m2 may be an indication of chronic kidney disease (CKD). Calculated eGFR, using the MRDR Study equation, is useful in  patients with stable renal function. The eGFR calculation will not be reliable in acutely ill patients when serum creatinine is changing rapidly. It is not useful in patients on dialysis. The eGFR calculation may not be applicable to patients at the low and high extremes of body sizes, pregnant women, and vegetarians.    GFR, Estimated  Date Value Ref Range Status  02/15/2022 >60 >60 mL/min Final    Comment:    (NOTE) Calculated using the CKD-EPI Creatinine Equation (2021)    eGFR  Date Value Ref Range Status  03/06/2022 90 >59 mL/min/1.73 Final         Passed - Valid encounter within last 12 months    Recent Outpatient Visits           5 months ago Pityriasis  rosea-like skin eruption   [place] [facility]   8 months ago Acute cough   [place] [facility]   8 months ago Annual physical exam   [facility]   1 year ago Establishing care with new doctor, encounter for   [facility]   4 years ago Establishing care with new doctor, encounter for   [facility]       Future Appointments             In 3 months Duanne Limerick, MD [facility] at [facility]

## 2022-12-08 ENCOUNTER — Other Ambulatory Visit: Payer: Self-pay

## 2022-12-08 ENCOUNTER — Encounter: Payer: Self-pay | Admitting: Emergency Medicine

## 2022-12-08 DIAGNOSIS — L02412 Cutaneous abscess of left axilla: Secondary | ICD-10-CM | POA: Diagnosis not present

## 2022-12-08 NOTE — ED Triage Notes (Addendum)
Patient endorses abscess under left armpit that he noticed tonight.  Patient reports it is painful to the touch. Two small red bumps noted to left axilla.

## 2022-12-09 ENCOUNTER — Emergency Department
Admission: EM | Admit: 2022-12-09 | Discharge: 2022-12-09 | Disposition: A | Discharge status: Home / Self Care | Payer: BC Managed Care – PPO | Attending: Emergency Medicine | Admitting: Emergency Medicine

## 2022-12-09 DIAGNOSIS — L02412 Cutaneous abscess of left axilla: Secondary | ICD-10-CM

## 2022-12-09 MED ORDER — KETOROLAC TROMETHAMINE 30 MG/ML IJ SOLN
30.0000 mg | Freq: Once | INTRAMUSCULAR | Status: AC
  Administered 2022-12-09: 30 mg via INTRAMUSCULAR
  Filled 2022-12-09: qty 1

## 2022-12-09 MED ORDER — SULFAMETHOXAZOLE-TRIMETHOPRIM 800-160 MG PO TABS
1.0000 | ORAL_TABLET | Freq: Once | ORAL | Status: AC
  Administered 2022-12-09: 1 via ORAL
  Filled 2022-12-09: qty 1

## 2022-12-09 MED ORDER — SULFAMETHOXAZOLE-TRIMETHOPRIM 800-160 MG PO TABS: 1.0000 | ORAL_TABLET | Freq: Two times a day (BID) | ORAL | 0 refills | Status: AC

## 2022-12-09 NOTE — Discharge Instructions (Addendum)
Please take Tylenol and ibuprofen/Advil for your pain.  It is safe to take them together, or to alternate them every few hours.  Take up to 1000mg  of Tylenol at a time, up to 4 times per day.  Do not take more than 4000 mg of Tylenol in 24 hours.  For ibuprofen, take 400-600 mg, 3 - 4 times per day.  Bactrim antibiotics twice daily for a week.  Follow-up with Dr. Yetta Barre next week for recheck  Return to the ED with any worsening symptoms despite these measures

## 2022-12-09 NOTE — ED Provider Notes (Signed)
   [facility] Provider Note    Event Date/Time   First MD Initiated Contact with Patient 12/09/22 [phone]     (approximate)   History   Abscess   HPI  Chris Spence. is a 52 y.o. male who presents to the ED for evaluation of Abscess   Patient presents with a small painful spot he noticed in the last few hours to his left axilla.  No trauma and no systemic symptoms.  No swelling or particular concerns with the left arm.   Physical Exam   Triage Vital Signs: ED Triage Vitals [12/08/22 2250]  Enc Vitals Group     BP (!) 170/114     Pulse Rate 79     Resp 18     Temp 98.1 F (36.7 C)     Temp Source Oral     SpO2 98 %     Weight 210 lb (95.3 kg)     Height 5\' 10"  (1.778 m)     Head Circumference      Peak Flow      Pain Score 6     Pain Loc      Pain Edu?      Excl. in GC?     Most recent vital signs: Vitals:   12/08/22 2250  BP: (!) 170/114  Pulse: 79  Resp: 18  Temp: 98.1 F (36.7 C)  SpO2: 98%    General: Awake, no distress.  CV:  Good peripheral perfusion.  Resp:  Normal effort.  Abd:  No distention.  MSK:  No deformity noted.  Neuro:  No focal deficits appreciated. Other:  Tiny palpable area of induration underlying the erythema pictured below     ED Results / Procedures / Treatments   Labs (all labs ordered are listed, but only abnormal results are displayed) Labs Reviewed - No data to display  EKG   RADIOLOGY   Official radiology report(s): No results found.  PROCEDURES and INTERVENTIONS:  Procedures  Medications  sulfamethoxazole-trimethoprim (BACTRIM DS) 800-160 MG per tablet 1 tablet (has no administration in time range)  ketorolac (TORADOL) 30 MG/ML injection 30 mg (has no administration in time range)     IMPRESSION / MDM / ASSESSMENT AND PLAN / ED COURSE  I reviewed the triage vital signs and the nursing notes.  Differential diagnosis includes, but is not limited to, abscess, cellulitis,  foreign body, DVT or thrombophlebitis  Patient presents with a few hours of a painful spot to his left axilla that likely represents an acute abscess suitable for outpatient management with antibiotics.      FINAL CLINICAL IMPRESSION(S) / ED DIAGNOSES   Final diagnoses:  Abscess of left axilla     Rx / DC Orders   ED Discharge Orders          Ordered    sulfamethoxazole-trimethoprim (BACTRIM DS) 800-160 MG tablet  2 times daily        12/09/22 0113             Note:  This document was prepared using Dragon voice recognition software and may include unintentional dictation errors.   Delton Prairie, MD 12/09/22 [phone]

## 2022-12-12 ENCOUNTER — Ambulatory Visit: Payer: BC Managed Care – PPO | Admitting: Family Medicine

## 2022-12-12 ENCOUNTER — Encounter: Payer: Self-pay | Admitting: Family Medicine

## 2022-12-12 VITALS — BP 120/74 | HR 74 | Ht 70.0 in | Wt 211.0 lb

## 2022-12-12 DIAGNOSIS — L0291 Cutaneous abscess, unspecified: Secondary | ICD-10-CM | POA: Diagnosis not present

## 2022-12-12 DIAGNOSIS — L02412 Cutaneous abscess of left axilla: Secondary | ICD-10-CM | POA: Diagnosis not present

## 2022-12-12 DIAGNOSIS — F17210 Nicotine dependence, cigarettes, uncomplicated: Secondary | ICD-10-CM | POA: Diagnosis not present

## 2022-12-12 NOTE — Progress Notes (Signed)
Date:  12/12/2022   Name:  Javid Imperatore.   DOB:  08/07/1970   MRN:  [id number]   Chief Complaint: Abscess (In L) armpit- been on sulfa since 6/29- not getting better)  Abscess This is a new problem. The current episode started in the past 7 days. The problem has been gradually worsening. Associated symptoms include chills. Pertinent negatives include no chest pain, congestion, fatigue or fever. Associated symptoms comments: Erythema/tenderness/ two palpable areas. Treatments tried: septra. The treatment provided no relief.    Lab Results  Component Value Date   NA 138 03/06/2022   K 4.5 03/06/2022   CO2 22 03/06/2022   GLUCOSE 97 03/06/2022   BUN 19 03/06/2022   CREATININE 1.02 03/06/2022   CALCIUM 9.2 03/06/2022   EGFR 90 03/06/2022   GFRNONAA >60 02/15/2022   Lab Results  Component Value Date   CHOL 240 (H) 06/16/2022   HDL 32 (L) 06/16/2022   LDLCALC 167 (H) 06/16/2022   TRIG 204 (H) 06/16/2022   CHOLHDL 7.5 06/16/2022   No results found for: "TSH" No results found for: "HGBA1C" Lab Results  Component Value Date   WBC 8.5 03/10/2022   HGB 15.7 03/10/2022   HCT 46.8 03/10/2022   MCV 84.2 03/10/2022   PLT 187 03/10/2022   No results found for: "ALT", "AST", "GGT", "ALKPHOS", "BILITOT" No results found for: "25OHVITD2", "25OHVITD3", "VD25OH"   Review of Systems  Constitutional:  Positive for chills. Negative for fatigue and fever.  HENT:  Negative for congestion and sinus pressure.   Respiratory:  Negative for shortness of breath and wheezing.   Cardiovascular:  Negative for chest pain, palpitations and leg swelling.  Gastrointestinal:  Negative for abdominal distention.    There are no problems to display for this patient.   Allergies  Allergen Reactions   Tramadol Nausea And Vomiting    Past Surgical History:  Procedure Laterality Date   APPENDECTOMY     CERVICAL FUSION     C-6 and C-7   EYE SURGERY     R) eye- "growth removed"  L) eye-  removed foreign object    Social History   Tobacco Use   Smoking status: Every Day    Packs/day: 1.00    Years: 40.00    Additional pack years: 0.00    Total pack years: 40.00    Types: Cigarettes   Smokeless tobacco: Never   Tobacco comments:    patches and pills discussed  Vaping Use   Vaping Use: Never used  Substance Use Topics   Alcohol use: Yes    Comment: occassionally   Drug use: No     Medication list has been reviewed and updated.  Current Meds  Medication Sig   albuterol (VENTOLIN HFA) 108 (90 Base) MCG/ACT inhaler Inhale 2 puffs into the lungs every 6 (six) hours as needed for wheezing or shortness of breath.   atorvastatin (LIPITOR) 10 MG tablet Take 1 tablet (10 mg total) by mouth daily.   fenofibrate (TRICOR) 145 MG tablet TAKE 1 TABLET(145 MG) BY MOUTH DAILY   Multiple Vitamins-Minerals (ONE-A-DAY MENS HEALTH FORMULA PO) Take 1 tablet by mouth daily.   sildenafil (VIAGRA) 50 MG tablet Take 1 tablet (50 mg total) by mouth daily as needed for erectile dysfunction.   sulfamethoxazole-trimethoprim (BACTRIM DS) 800-160 MG tablet Take 1 tablet by mouth 2 (two) times daily for 7 days.       06/16/2022    8:20 AM 03/10/2022  11:05 AM 03/06/2022    9:50 AM 04/12/2021    3:13 PM  GAD 7 : Generalized Anxiety Score  Nervous, Anxious, on Edge 0 0 0 1  Control/stop worrying 0 0 0 0  Worry too much - different things 0 0 0 0  Trouble relaxing 0 0 0 1  Restless 0 0 0 3  Easily annoyed or irritable 0 0 0 3  Afraid - awful might happen 0 0 0 0  Total GAD 7 Score 0 0 0 8  Anxiety Difficulty Not difficult at all Not difficult at all Not difficult at all Not difficult at all       06/16/2022    8:19 AM 03/10/2022   11:05 AM 03/06/2022    9:50 AM  Depression screen PHQ 2/9  Decreased Interest 0 0 0  Down, Depressed, Hopeless 0 0 0  PHQ - 2 Score 0 0 0  Altered sleeping 0 0 0  Tired, decreased energy 0 0 0  Change in appetite 0 0 0  Feeling bad or failure about  yourself  0 0 0  Trouble concentrating 0 0 0  Moving slowly or fidgety/restless 0 0 0  Suicidal thoughts 0 0 0  PHQ-9 Score 0 0 0  Difficult doing work/chores Not difficult at all Not difficult at all Not difficult at all    BP Readings from Last 3 Encounters:  12/12/22 120/74  12/08/22 (!) 170/114  06/16/22 120/78    Physical Exam Vitals and nursing note reviewed.  HENT:     Head: Normocephalic.     Right Ear: Tympanic membrane and external ear normal.     Left Ear: Tympanic membrane and external ear normal.     Nose: Nose normal.     Mouth/Throat:     Mouth: Mucous membranes are moist.  Eyes:     General: No scleral icterus.       Right eye: No discharge.        Left eye: No discharge.     Conjunctiva/sclera: Conjunctivae normal.     Pupils: Pupils are equal, round, and reactive to light.  Cardiovascular:     Rate and Rhythm: Normal rate and regular rhythm.     Heart sounds: Normal heart sounds. No murmur heard.    No friction rub. No gallop.  Pulmonary:     Effort: No respiratory distress.     Breath sounds: Normal breath sounds. No wheezing, rhonchi or rales.  Abdominal:     General: Bowel sounds are normal.     Palpations: Abdomen is soft. There is no mass.     Tenderness: There is no abdominal tenderness. There is no guarding or rebound.  Musculoskeletal:        General: No tenderness.     Cervical back: Neck supple.  Skin:    Findings: Erythema present. No rash.     Comments: Tenderness/ fluctulence  Neurological:     Mental Status: He is alert.     Wt Readings from Last 3 Encounters:  12/12/22 211 lb (95.7 kg)  12/08/22 210 lb (95.3 kg)  06/16/22 207 lb (93.9 kg)    BP 120/74   Pulse 74   Ht 5\' 10"  (1.778 m)   Wt 211 lb (95.7 kg)   SpO2 95%   BMI 30.28 kg/m   Assessment and Plan:  1. Abscess New onset.  Persistent.  Gradually worsening despite antibiotic IM and oral Septra DS twice daily.  2 areas of fluctuant now of  the left axillary that  are tender to touch and erythematous and fluctuant.  Area needs to be incised and drained and will send to ER facility and that there is no one in general surgery available to do needed surgical incision.   Elizabeth Sauer, MD

## 2022-12-13 ENCOUNTER — Telehealth: Payer: Self-pay

## 2022-12-13 NOTE — Transitions of Care (Post Inpatient/ED Visit) (Signed)
   12/13/2022  Name: Chris Spence. MRN: [id number] DOB: Sep 12, 1970  Today's TOC FU Call Status: Today's TOC FU Call Status:: Unsuccessul Call (1st Attempt) Unsuccessful Call (1st Attempt) Date: 12/13/22  Attempted to reach the patient regarding the most recent Inpatient/ED visit.  Follow Up Plan: Additional outreach attempts will be made to reach the patient to complete the Transitions of Care (Post Inpatient/ED visit) call.   [employer], CMA

## 2022-12-15 NOTE — Transitions of Care (Post Inpatient/ED Visit) (Signed)
   12/15/2022  Name: Chris Spence. MRN: [id number] DOB: March 31, 1971  Today's TOC FU Call Status: Today's TOC FU Call Status:: Successful TOC FU Call Competed Unsuccessful Call (1st Attempt) Date: 12/13/22 [facility] FU Call Complete Date: 12/15/22  Transition Care Management Follow-up Telephone Call Date of Discharge: 12/12/22 Discharge Facility:  [facility]) Type of Discharge: Emergency Department Reason for ED Visit:  (abscess) How have you been since you were released from the hospital?: Better Any questions or concerns?: No  Items Reviewed: Did you receive and understand the discharge instructions provided?: Yes Medications obtained,verified, and reconciled?: Yes (Medications Reviewed) Any new allergies since your discharge?: No Dietary orders reviewed?: No Do you have support at home?: Yes  Medications Reviewed Today: Medications Reviewed Today     Reviewed by Duanne Limerick, MD (Physician) on 12/12/22 at 1043  Med List Status: <None>   Medication Order Taking? Sig Documenting Provider Last Dose Status Informant  albuterol (VENTOLIN HFA) 108 (90 Base) MCG/ACT inhaler [id number] Yes Inhale 2 puffs into the lungs every 6 (six) hours as needed for wheezing or shortness of breath. Duanne Limerick, MD Taking Active   atorvastatin (LIPITOR) 10 MG tablet [id number] Yes Take 1 tablet (10 mg total) by mouth daily. Duanne Limerick, MD Taking Active   fenofibrate (TRICOR) 145 MG tablet [id number] Yes TAKE 1 TABLET(145 MG) BY MOUTH DAILY Duanne Limerick, MD Taking Active   Multiple Vitamins-Minerals (ONE-A-DAY MENS HEALTH FORMULA PO) [id number] Yes Take 1 tablet by mouth daily. [provider] Taking Active   sildenafil (VIAGRA) 50 MG tablet [id number] Yes Take 1 tablet (50 mg total) by mouth daily as needed for erectile dysfunction. Duanne Limerick, MD Taking Active   sulfamethoxazole-trimethoprim (BACTRIM DS) 800-160 MG tablet [id number] Yes Take 1 tablet by mouth 2 (two) times  daily for 7 days. Delton Prairie, MD Taking Active             Home Care and Equipment/Supplies: Were Home Health Services Ordered?: No Any new equipment or medical supplies ordered?: No  Functional Questionnaire: Do you need assistance with bathing/showering or dressing?: No Do you need assistance with meal preparation?: No Do you need assistance with eating?: No Do you have difficulty maintaining continence: No Do you need assistance with getting out of bed/getting out of a chair/moving?: No Do you have difficulty managing or taking your medications?: No  Follow up appointments reviewed: PCP Follow-up appointment confirmed?: NA (pt did not need to be seen) Do you need transportation to your follow-up appointment?: No Do you understand care options if your condition(s) worsen?: Yes-patient verbalized understanding    SIGNATURE Mariann Barter Boniface Goffe, CMA

## 2022-12-27 ENCOUNTER — Other Ambulatory Visit: Payer: Self-pay | Admitting: Family Medicine

## 2022-12-27 DIAGNOSIS — E785 Hyperlipidemia, unspecified: Secondary | ICD-10-CM

## 2022-12-27 NOTE — Telephone Encounter (Signed)
Requested Prescriptions  Pending Prescriptions Disp Refills   atorvastatin (LIPITOR) 10 MG tablet [Pharmacy Med Name: ATORVASTATIN 10MG  TABLETS] 30 tablet 1    Sig: TAKE 1 TABLET(10 MG) BY MOUTH DAILY     Cardiovascular:  Antilipid - Statins Failed - 12/27/2022  3:22 AM      Failed - Lipid Panel in normal range within the last 12 months    Cholesterol, Total  Date Value Ref Range Status  03/06/2022 221 (H) 100 - 199 mg/dL Final   Cholesterol  Date Value Ref Range Status  06/16/2022 240 (H) 0 - 200 mg/dL Final   LDL Chol Calc (NIH)  Date Value Ref Range Status  03/06/2022 112 (H) 0 - 99 mg/dL Final   LDL Cholesterol  Date Value Ref Range Status  06/16/2022 167 (H) 0 - 99 mg/dL Final    Comment:           Total Cholesterol/HDL:CHD Risk Coronary Heart Disease Risk Table                     Men   Women  1/2 Average Risk   3.4   3.3  Average Risk       5.0   4.4  2 X Average Risk   9.6   7.1  3 X Average Risk  23.4   11.0        Use the calculated Patient Ratio above and the CHD Risk Table to determine the patient's CHD Risk.        ATP III CLASSIFICATION (LDL):  <100     mg/dL   Optimal  295-621  mg/dL   Near or Above                    Optimal  130-159  mg/dL   Borderline  308-657  mg/dL   High  >846     mg/dL   Very High Performed at [facility], [address] [place], [place] [address]    HDL  Date Value Ref Range Status  06/16/2022 32 (L) >40 mg/dL Final  28/41/3244 31 (L) >39 mg/dL Final   Triglycerides  Date Value Ref Range Status  06/16/2022 204 (H) <150 mg/dL Final         Passed - Patient is not pregnant      Passed - Valid encounter within last 12 months    Recent Outpatient Visits           2 weeks ago Abscess   [place] [facility]   6 months ago Pityriasis rosea-like skin eruption   [place] [facility]   9  months ago Acute cough   [place] [facility]   9 months ago Annual physical exam   [facility] at [facility]   1 year ago Establishing care with new doctor, encounter for   [facility] at [facility]       Future Appointments             In 2 months Duanne Limerick, MD [facility] at [facility], [facility]

## 2023-02-21 ENCOUNTER — Encounter: Payer: Self-pay | Admitting: Family Medicine

## 2023-02-21 ENCOUNTER — Telehealth (INDEPENDENT_AMBULATORY_CARE_PROVIDER_SITE_OTHER): Payer: BC Managed Care – PPO | Admitting: Family Medicine

## 2023-02-21 ENCOUNTER — Ambulatory Visit: Payer: Self-pay | Admitting: *Deleted

## 2023-02-21 DIAGNOSIS — U071 COVID-19: Secondary | ICD-10-CM | POA: Diagnosis not present

## 2023-02-21 MED ORDER — NIRMATRELVIR/RITONAVIR (PAXLOVID)TABLET: 3.0000 | ORAL_TABLET | Freq: Two times a day (BID) | ORAL | 0 refills | Status: AC

## 2023-02-21 NOTE — Telephone Encounter (Signed)
Message from Chris Spence sent at 02/21/2023  9:04 AM EDT  Summary: covid positive/ no energy/ requesting medication   Patient called and stated that he tested positive for covid with home test today and was wanting to know if there is something that can be prescribed for this or that he can get to take? Patient says his symptoms are mainly coughing a lot and body aches, feels like he has no energy and a little congestion.  Patients callback # [phone]          Call History  Contact Date/Time Type Contact Phone/Fax User  02/21/2023 09:01 AM EDT Phone (Incoming) Ronna Polio. (Self) [phone] (H) Muck, Army Melia   Reason for Disposition  [1] COVID-19 diagnosed by positive lab test (e.g., PCR, rapid self-test kit) AND [2] mild symptoms (e.g., cough, fever, others) AND [3] no complications or SOB  Answer Assessment - Initial Assessment Questions 1. COVID-19 DIAGNOSIS: "How do you know that you have COVID?" (e.g., positive lab test or self-test, diagnosed by doctor or NP/PA, symptoms after exposure).     Positive for Covid 2. COVID-19 EXPOSURE: "Was there any known exposure to COVID before the symptoms began?" CDC Definition of close contact: within 6 feet (2 meters) for a total of 15 minutes or more over a 24-hour period.      Not asked 3. ONSET: "When did the COVID-19 symptoms start?"      Last night 10:00 PM     I got worse during the night. 4. WORST SYMPTOM: "What is your worst symptom?" (e.g., cough, fever, shortness of breath, muscle aches)     worse 5. COUGH: "Do you have a cough?" If Yes, ask: "How bad is the cough?"       Yes   Not coughing up anything 6. FEVER: "Do you have a fever?" If Yes, ask: "What is your temperature, how was it measured, and when did it start?"     98-99    I'm having body aches and chills.  No headache.  I'm taking Tylenol and ibuprofen. 7. RESPIRATORY STATUS: "Describe your breathing?" (e.g., normal; shortness of breath, wheezing, unable to  speak)      I feel I could have soreness in my chest from coughing. 8. BETTER-SAME-WORSE: "Are you getting better, staying the same or getting worse compared to yesterday?"  If getting worse, ask, "In what way?"     Worse this morning 9. OTHER SYMPTOMS: "Do you have any other symptoms?"  (e.g., chills, fatigue, headache, loss of smell or taste, muscle pain, sore throat)     Fatigue, scratchy throat, body aches and coughing 10. HIGH RISK DISEASE: "Do you have any chronic medical problems?" (e.g., asthma, heart or lung disease, weak immune system, obesity, etc.)       No  11. VACCINE: "Have you had the COVID-19 vaccine?" If Yes, ask: "Which one, how many shots, when did you get it?"       Not asked 12. PREGNANCY: "Is there any chance you are pregnant?" "When was your last menstrual period?"       N/A 13. O2 SATURATION MONITOR:  "Do you use an oxygen saturation monitor (pulse oximeter) at home?" If Yes, ask "What is your reading (oxygen level) today?" "What is your usual oxygen saturation reading?" (e.g., 95%)       N/A  Protocols used: Coronavirus (COVID-19) Diagnosed or Suspected-A-AH

## 2023-02-21 NOTE — Assessment & Plan Note (Addendum)
Noted 1 day of progressive symptoms with throat pain, myalgias, nonproductive cough, denies fevers, SOA. Took Motrin and Tylenol, helping somewhat. Has had 3 times prior.  We reviewed treatment options and given his chronic smoking and BMI he may benefit from antiviral treatment and he was offered this as an adjunct to supportive care.  Plan: - Continue Tylenol and/or Motrin - Use Paxlovid, take for full course - Can return to work pending symptoms (see diagram below) followed by 5 days of extra precautions (mask, etc) - Contact for any rebound / worsening symptoms

## 2023-02-21 NOTE — Patient Instructions (Signed)
-   Continue Tylenol and/or Motrin - Use Paxlovid, take for full course - Can return to work pending symptoms (see diagram below) followed by 5 days of extra precautions (mask, etc) - Contact for any rebound / worsening symptoms

## 2023-02-21 NOTE — Telephone Encounter (Signed)
  Chief Complaint: Positive for Covid via a home test this morning.  Requesting an antiviral. Symptoms: Symptoms started last night about 10:00, Body aches, chills, low grade fever, scratchy throat, congestion and a cough. Frequency: Started last night  Feels worse this morning. Pertinent Negatives: Patient denies shortness of breath or chest congestion. Disposition: [] ED /[] Urgent Care (no appt availability in office) / [x] Appointment(In office/virtual)/ []  [facility]/ [] Home Care/ [] Refused Recommended Disposition /[] [facility]/ []  Follow-up with PCP Additional Notes: Video visit made with Dr. Joseph Berkshire for 11:00 this morning.   No appt available with Dr. Yetta Barre, his PCP for today.

## 2023-02-21 NOTE — Progress Notes (Signed)
Primary Care / Sports Medicine Virtual Visit  Patient Information:  Patient ID: Chris Miracle., male DOB: Sep 16, 1970 Age: 52 y.o. MRN: [id number]   Chris Bobst. is a pleasant 52 y.o. male presenting with the following:  Chief Complaint  Patient presents with   Covid Positive    This morning, cough, sore throat, sinus pressure yesterday     Review of Systems: No fevers, chills, night sweats, weight loss, chest pain, or shortness of breath.   Patient Active Problem List   Diagnosis Date Noted   COVID 02/21/2023   History reviewed. No pertinent past medical history. Outpatient Encounter Medications as of 02/21/2023  Medication Sig   Multiple Vitamins-Minerals (ONE-A-DAY MENS HEALTH FORMULA PO) Take 1 tablet by mouth daily.   nirmatrelvir/ritonavir (PAXLOVID) 20 x 150 MG & 10 x 100MG  TABS Take 3 tablets by mouth 2 (two) times daily for 5 days. (Take nirmatrelvir 150 mg two tablets twice daily for 5 days and ritonavir 100 mg one tablet twice daily for 5 days) Patient GFR is 90   [DISCONTINUED] atorvastatin (LIPITOR) 10 MG tablet TAKE 1 TABLET(10 MG) BY MOUTH DAILY   [DISCONTINUED] albuterol (VENTOLIN HFA) 108 (90 Base) MCG/ACT inhaler Inhale 2 puffs into the lungs every 6 (six) hours as needed for wheezing or shortness of breath.   [DISCONTINUED] fenofibrate (TRICOR) 145 MG tablet TAKE 1 TABLET(145 MG) BY MOUTH DAILY   [DISCONTINUED] sildenafil (VIAGRA) 50 MG tablet Take 1 tablet (50 mg total) by mouth daily as needed for erectile dysfunction.   No facility-administered encounter medications on file as of 02/21/2023.   Past Surgical History:  Procedure Laterality Date   APPENDECTOMY     CERVICAL FUSION     C-6 and C-7   EYE SURGERY     R) eye- "growth removed"  L) eye- removed foreign object    Virtual Visit via MyChart Video:   I connected with Chris Spence. on 02/21/23 via MyChart Video and verified that I am speaking with the correct person using appropriate  identifiers.   The limitations, risks, security and privacy concerns of performing an evaluation and management service by MyChart Video, including the higher likelihood of inaccurate diagnoses and treatments, and the availability of in person appointments were reviewed. The possible need of an additional face-to-face encounter for complete and high quality delivery of care was discussed. The patient was also made aware that there may be a patient responsible charge related to this service. The patient expressed understanding and wishes to proceed.  Provider location is in medical facility. Patient location is at their home, different from provider location. People involved in care of the patient during this telehealth encounter were myself, my nurse/medical assistant, and my front office/scheduling team member.  Objective findings:   General: Speaking full sentences, no audible heavy breathing. Sounds alert and appropriately interactive. Well-appearing. Face symmetric. Extraocular movements intact. Pupils equal and round. No nasal flaring or accessory muscle use visualized.  Independent interpretation of notes and tests performed by another provider:   None  Pertinent History, Exam, Impression, and Recommendations:   Problem List Items Addressed This Visit       Other   COVID - Primary    Noted 1 day of progressive symptoms with throat pain, myalgias, nonproductive cough, denies fevers, SOA. Took Motrin and Tylenol, helping somewhat. Has had 3 times prior.  We reviewed treatment options and given his chronic smoking and BMI he may benefit from antiviral treatment  and he was offered this as an adjunct to supportive care.  Plan: - Continue Tylenol and/or Motrin - Use Paxlovid, take for full course - Can return to work pending symptoms (see diagram below) followed by 5 days of extra precautions (mask, etc) - Contact for any rebound / worsening symptoms      Relevant Medications    nirmatrelvir/ritonavir (PAXLOVID) 20 x 150 MG & 10 x 100MG  TABS     Orders & Medications Medications:  Meds ordered this encounter  Medications   nirmatrelvir/ritonavir (PAXLOVID) 20 x 150 MG & 10 x 100MG  TABS    Sig: Take 3 tablets by mouth 2 (two) times daily for 5 days. (Take nirmatrelvir 150 mg two tablets twice daily for 5 days and ritonavir 100 mg one tablet twice daily for 5 days) Patient GFR is 90    Dispense:  30 tablet    Refill:  0   No orders of the defined types were placed in this encounter.    I discussed the above assessment and treatment plan with the patient. The patient was provided an opportunity to ask questions and all were answered. The patient agreed with the plan and demonstrated an understanding of the instructions.   The patient was advised to call back or seek an in-person evaluation if the symptoms worsen or if the condition fails to improve as anticipated.   I provided a total time of 30 minutes including both face-to-face and non-face-to-face time on 02/21/2023 inclusive of time utilized for medical chart review, information gathering, care coordination with staff, and documentation completion.    Jerrol Banana, MD, [facility]   Primary Care Sports Medicine Primary Care and Sports Medicine at [facility]

## 2023-03-09 ENCOUNTER — Encounter: Payer: BC Managed Care – PPO | Admitting: Family Medicine

## 2023-04-09 ENCOUNTER — Other Ambulatory Visit: Payer: Self-pay | Admitting: Family Medicine

## 2023-04-09 DIAGNOSIS — N529 Male erectile dysfunction, unspecified: Secondary | ICD-10-CM

## 2023-04-09 NOTE — Telephone Encounter (Signed)
Medication Refill - Medication: sildenafil (VIAGRA) 50 MG tablet   Has the patient contacted their pharmacy? Yes.      Preferred Pharmacy (with phone number or street name):  [id number] - [place], [place] - [address] AT [facility] OF [address] [facility]  [address] [place] [address]  Phone: [phone] Fax: [phone]      Has the patient been seen for an appointment in the last year OR does the patient have an upcoming appointment? Yes.    Agent: Please be advised that RX refills may take up to 3 business days. We ask that you follow-up with your pharmacy.

## 2023-04-10 NOTE — Telephone Encounter (Signed)
D/C 02/21/23. Requested Prescriptions  Refused Prescriptions Disp Refills   sildenafil (VIAGRA) 50 MG tablet 10 tablet 5    Sig: Take 1 tablet (50 mg total) by mouth daily as needed for erectile dysfunction.     Urology: Erectile Dysfunction Agents Failed - 04/09/2023 10:11 AM      Failed - AST in normal range and within 360 days    No results found for: "POCAST", "AST"       Failed - ALT in normal range and within 360 days    No results found for: "ALT", "LABALT", "POCALT"       Passed - Last BP in normal range    BP Readings from Last 1 Encounters:  12/12/22 120/74         Passed - Valid encounter within last 12 months    Recent Outpatient Visits           1 month ago COVID   [facility] at [facility], Ocie Bob, MD   3 months ago Abscess   [facility]   9 months ago Pityriasis rosea-like skin eruption   [place] [facility], [facility]   1 year ago Acute cough   [place] [facility], MD   1 year ago Annual physical exam   [facility] at [facility], [facility]       Future Appointments             In 3 days Duanne Limerick, MD [facility] Care & Sports Medicine at [facility], [facility]

## 2023-04-13 ENCOUNTER — Encounter: Payer: BC Managed Care – PPO | Admitting: Family Medicine

## 2023-04-14 ENCOUNTER — Other Ambulatory Visit: Payer: Self-pay | Admitting: Family Medicine

## 2023-04-19 ENCOUNTER — Encounter: Payer: BC Managed Care – PPO | Admitting: Family Medicine

## 2023-04-23 ENCOUNTER — Ambulatory Visit (INDEPENDENT_AMBULATORY_CARE_PROVIDER_SITE_OTHER): Payer: BC Managed Care – PPO | Admitting: Family Medicine

## 2023-04-23 ENCOUNTER — Encounter: Payer: Self-pay | Admitting: Family Medicine

## 2023-04-23 VITALS — BP 122/78 | HR 78 | Ht 70.0 in | Wt 208.0 lb

## 2023-04-23 DIAGNOSIS — E785 Hyperlipidemia, unspecified: Secondary | ICD-10-CM

## 2023-04-23 DIAGNOSIS — N529 Male erectile dysfunction, unspecified: Secondary | ICD-10-CM | POA: Diagnosis not present

## 2023-04-23 DIAGNOSIS — Z0000 Encounter for general adult medical examination without abnormal findings: Secondary | ICD-10-CM

## 2023-04-23 DIAGNOSIS — Z1211 Encounter for screening for malignant neoplasm of colon: Secondary | ICD-10-CM

## 2023-04-23 MED ORDER — SILDENAFIL CITRATE 100 MG PO TABS: 100.0000 mg | ORAL_TABLET | Freq: Every day | ORAL | 11 refills | Status: DC | PRN

## 2023-04-23 NOTE — Progress Notes (Signed)
Date:  04/23/2023   Name:  Harnoor Brosch.   DOB:  03-28-1971   MRN:  [id number]   Chief Complaint: Annual Exam  Patient is a 52 year old male who presents for a comprehensive physical exam. The patient reports the following problems: none. Health maintenance has been reviewed up-to-date     Lab Results  Component Value Date   NA 138 03/06/2022   K 4.5 03/06/2022   CO2 22 03/06/2022   GLUCOSE 97 03/06/2022   BUN 19 03/06/2022   CREATININE 1.02 03/06/2022   CALCIUM 9.2 03/06/2022   EGFR 90 03/06/2022   GFRNONAA >60 02/15/2022   Lab Results  Component Value Date   CHOL 240 (H) 06/16/2022   HDL 32 (L) 06/16/2022   LDLCALC 167 (H) 06/16/2022   TRIG 204 (H) 06/16/2022   CHOLHDL 7.5 06/16/2022   No results found for: "TSH" No results found for: "HGBA1C" Lab Results  Component Value Date   WBC 8.5 03/10/2022   HGB 15.7 03/10/2022   HCT 46.8 03/10/2022   MCV 84.2 03/10/2022   PLT 187 03/10/2022   No results found for: "ALT", "AST", "GGT", "ALKPHOS", "BILITOT" No results found for: "25OHVITD2", "25OHVITD3", "VD25OH"   Review of Systems  Constitutional:  Negative for chills and fever.  HENT:  Negative for drooling, ear discharge, ear pain and sore throat.   Respiratory:  Negative for cough, shortness of breath and wheezing.   Cardiovascular:  Negative for chest pain, palpitations and leg swelling.  Gastrointestinal:  Negative for abdominal pain, blood in stool, constipation, diarrhea and nausea.  Endocrine: Negative for polydipsia.  Genitourinary:  Negative for dysuria, frequency, hematuria and urgency.  Musculoskeletal:  Negative for back pain, myalgias and neck pain.  Skin:  Negative for rash.  Allergic/Immunologic: Negative for environmental allergies.  Neurological:  Negative for dizziness and headaches.  Hematological:  Does not bruise/bleed easily.  Psychiatric/Behavioral:  Negative for suicidal ideas. The patient is not nervous/anxious.     Patient  Active Problem List   Diagnosis Date Noted  . COVID 02/21/2023    Allergies  Allergen Reactions  . Tramadol Nausea And Vomiting    Past Surgical History:  Procedure Laterality Date  . APPENDECTOMY    . CERVICAL FUSION     C-6 and C-7  . EYE SURGERY     R) eye- "growth removed"  L) eye- removed foreign object    Social History   Tobacco Use  . Smoking status: Every Day    Current packs/day: 1.00    Average packs/day: 1 pack/day for 40.0 years (40.0 ttl pk-yrs)    Types: Cigarettes  . Smokeless tobacco: Never  . Tobacco comments:    patches and pills discussed  Vaping Use  . Vaping status: Never Used  Substance Use Topics  . Alcohol use: Yes    Comment: occassionally  . Drug use: No     Medication list has been reviewed and updated.  No outpatient medications have been marked as taking for the 04/23/23 encounter (Office Visit) with Duanne Limerick, MD.       06/16/2022    8:20 AM 03/10/2022   11:05 AM 03/06/2022    9:50 AM 04/12/2021    3:13 PM  GAD 7 : Generalized Anxiety Score  Nervous, Anxious, on Edge 0 0 0 1  Control/stop worrying 0 0 0 0  Worry too much - different things 0 0 0 0  Trouble relaxing 0 0 0 1  Restless  0 0 0 3  Easily annoyed or irritable 0 0 0 3  Afraid - awful might happen 0 0 0 0  Total GAD 7 Score 0 0 0 8  Anxiety Difficulty Not difficult at all Not difficult at all Not difficult at all Not difficult at all       06/16/2022    8:19 AM 03/10/2022   11:05 AM 03/06/2022    9:50 AM  Depression screen PHQ 2/9  Decreased Interest 0 0 0  Down, Depressed, Hopeless 0 0 0  PHQ - 2 Score 0 0 0  Altered sleeping 0 0 0  Tired, decreased energy 0 0 0  Change in appetite 0 0 0  Feeling bad or failure about yourself  0 0 0  Trouble concentrating 0 0 0  Moving slowly or fidgety/restless 0 0 0  Suicidal thoughts 0 0 0  PHQ-9 Score 0 0 0  Difficult doing work/chores Not difficult at all Not difficult at all Not difficult at all    BP Readings  from Last 3 Encounters:  12/12/22 120/74  12/08/22 (!) 170/114  06/16/22 120/78    Physical Exam Vitals and nursing note reviewed.  Constitutional:      Appearance: Normal appearance.  HENT:     Head: Normocephalic.     Jaw: There is normal jaw occlusion.     Right Ear: Hearing, tympanic membrane, ear canal and external ear normal. There is no impacted cerumen.     Left Ear: Hearing, tympanic membrane, ear canal and external ear normal. There is no impacted cerumen.     Nose: Nose normal. No congestion or rhinorrhea.     Mouth/Throat:     Lips: Pink.     Mouth: Mucous membranes are moist. Mucous membranes are pale.     Tongue: No lesions.     Palate: No mass.     Pharynx: No oropharyngeal exudate or posterior oropharyngeal erythema.  Eyes:     General: Lids are normal. Vision grossly intact. Gaze aligned appropriately. No scleral icterus.       Right eye: No discharge.        Left eye: No discharge.     Extraocular Movements: Extraocular movements intact.     Conjunctiva/sclera: Conjunctivae normal.     Pupils: Pupils are equal, round, and reactive to light.  Neck:     Thyroid: No thyroid mass, thyromegaly or thyroid tenderness.     Vascular: Normal carotid pulses. No carotid bruit, hepatojugular reflux or JVD.     Trachea: Trachea normal. No tracheal deviation.  Cardiovascular:     Rate and Rhythm: Normal rate and regular rhythm.     Heart sounds: Normal heart sounds, S1 normal and S2 normal. No murmur heard.    No systolic murmur is present.     No diastolic murmur is present.     No friction rub. No gallop. No S3 or S4 sounds.  Pulmonary:     Effort: No respiratory distress.     Breath sounds: Normal breath sounds. No stridor or transmitted upper airway sounds. No decreased breath sounds, wheezing, rhonchi or rales.  Chest:     Chest wall: No tenderness.  Breasts:    Right: Normal. No mass.     Left: Normal. No mass.  Abdominal:     General: Bowel sounds are normal.  There is no distension.     Palpations: Abdomen is soft. There is no hepatomegaly, splenomegaly or mass.     Tenderness: There  is no abdominal tenderness. There is no right CVA tenderness, left CVA tenderness, guarding or rebound.  Genitourinary:    Penis: Normal.      Testes: Normal.        Right: Mass not present.        Left: Mass not present.     Epididymis:     Right: Normal.     Left: Normal.     Prostate: Normal. Not enlarged, not tender and no nodules present.     Rectum: Normal. Guaiac result negative. No mass.  Musculoskeletal:        General: No tenderness. Normal range of motion.     Cervical back: Normal range of motion and neck supple.     Right lower leg: No edema.     Left lower leg: No edema.  Lymphadenopathy:     Head:     Right side of head: No submental or submandibular adenopathy.     Left side of head: No submental or submandibular adenopathy.     Cervical: No cervical adenopathy.     Right cervical: No superficial, deep or posterior cervical adenopathy.    Left cervical: No superficial, deep or posterior cervical adenopathy.     Upper Body:     Right upper body: No supraclavicular, axillary or pectoral adenopathy.     Left upper body: No supraclavicular, axillary or pectoral adenopathy.  Skin:    General: Skin is warm.     Capillary Refill: Capillary refill takes less than 2 seconds.     Findings: No rash.  Neurological:     Mental Status: He is alert and oriented to person, place, and time.     Cranial Nerves: Cranial nerves 2-12 are intact. No cranial nerve deficit.     Sensory: Sensation is intact.     Motor: Motor function is intact.     Deep Tendon Reflexes: Reflexes are normal and symmetric.     Reflex Scores:      Tricep reflexes are 2+ on the right side and 2+ on the left side.      Bicep reflexes are 2+ on the right side and 2+ on the left side.      Brachioradialis reflexes are 2+ on the right side and 2+ on the left side.      Patellar  reflexes are 2+ on the right side and 2+ on the left side.      Achilles reflexes are 2+ on the right side and 2+ on the left side. Psychiatric:        Behavior: Behavior is cooperative.    Wt Readings from Last 3 Encounters:  12/12/22 211 lb (95.7 kg)  12/08/22 210 lb (95.3 kg)  06/16/22 207 lb (93.9 kg)    Ht 5\' 10"  (1.778 m)   BMI 30.28 kg/m   Assessment and Plan:  Maxen Litherland. is a 52 y.o. male who presents today for his Complete Annual Exam. He feels well. He reports exercising daily. He reports he is sleeping well.  Immunizations are reviewed and recommendations provided.   Age appropriate screening tests are discussed. Counseling given for risk factor reduction interventions.  1. Annual physical exam No subjective/objective concerns noted during HPI, review of past medical history/medications/past labs and review of systems and physical exam.  Will check lipid panel PSA comprehensive metabolic panel as well as CBC for concerns. - Lipid Panel With LDL/HDL Ratio - PSA - Comprehensive metabolic panel - CBC with Differential/Platelet  2. Hyperlipidemia,  unspecified hyperlipidemia type Patient with history of hyperlipidemia which he stopped taking his statin because he read that it causes abscess.  I had a long discussion that this is unlikely a cause-and-effect in his case and that it would be prudent to resume his cholesterol but we will check his cholesterol first to see if that will be necessary.  3. Vasculogenic erectile dysfunction, unspecified vasculogenic erectile dysfunction type Chronic.  Controlled.  Stable.  Patient is having decreased results on 50 mg and we will increase to 100 mg Viagra on an as-needed basis. - sildenafil (VIAGRA) 100 MG tablet; Take 1 tablet (100 mg total) by mouth daily as needed for erectile dysfunction.  Dispense: 10 tablet; Refill: 11  4. Colon cancer screening Discussed and patient has agreed to FIT testing. - Fecal occult blood,  imunochemical   Elizabeth Sauer, MD

## 2023-05-18 DIAGNOSIS — J069 Acute upper respiratory infection, unspecified: Secondary | ICD-10-CM | POA: Diagnosis not present

## 2023-05-21 ENCOUNTER — Other Ambulatory Visit: Payer: Self-pay

## 2023-05-21 ENCOUNTER — Emergency Department: Payer: BC Managed Care – PPO

## 2023-05-21 ENCOUNTER — Emergency Department
Admission: EM | Admit: 2023-05-21 | Discharge: 2023-05-21 | Disposition: A | Discharge status: Home / Self Care | Payer: BC Managed Care – PPO | Attending: Emergency Medicine | Admitting: Emergency Medicine

## 2023-05-21 DIAGNOSIS — F17210 Nicotine dependence, cigarettes, uncomplicated: Secondary | ICD-10-CM | POA: Insufficient documentation

## 2023-05-21 DIAGNOSIS — R059 Cough, unspecified: Secondary | ICD-10-CM | POA: Diagnosis not present

## 2023-05-21 DIAGNOSIS — Z1152 Encounter for screening for COVID-19: Secondary | ICD-10-CM | POA: Diagnosis not present

## 2023-05-21 DIAGNOSIS — R0602 Shortness of breath: Secondary | ICD-10-CM | POA: Diagnosis not present

## 2023-05-21 DIAGNOSIS — J189 Pneumonia, unspecified organism: Secondary | ICD-10-CM

## 2023-05-21 DIAGNOSIS — R918 Other nonspecific abnormal finding of lung field: Secondary | ICD-10-CM | POA: Diagnosis not present

## 2023-05-21 DIAGNOSIS — J181 Lobar pneumonia, unspecified organism: Secondary | ICD-10-CM | POA: Diagnosis not present

## 2023-05-21 LAB — CBC
HCT: 45.2 % (ref 39.0–52.0)
Hemoglobin: 15.7 g/dL (ref 13.0–17.0)
MCH: 28.8 pg (ref 26.0–34.0)
MCHC: 34.7 g/dL (ref 30.0–36.0)
MCV: 82.8 fL (ref 80.0–100.0)
Platelets: 186 10*3/uL (ref 150–400)
RBC: 5.46 MIL/uL (ref 4.22–5.81)
RDW: 12.2 % (ref 11.5–15.5)
WBC: 10 10*3/uL (ref 4.0–10.5)
nRBC: 0 % (ref 0.0–0.2)

## 2023-05-21 LAB — RESP PANEL BY RT-PCR (RSV, FLU A&B, COVID)  RVPGX2
Influenza A by PCR: NEGATIVE
Influenza B by PCR: NEGATIVE
Resp Syncytial Virus by PCR: NEGATIVE
SARS Coronavirus 2 by RT PCR: NEGATIVE

## 2023-05-21 LAB — BASIC METABOLIC PANEL
Anion gap: 7 (ref 5–15)
BUN: 12 mg/dL (ref 6–20)
CO2: 26 mmol/L (ref 22–32)
Calcium: 8.5 mg/dL — ABNORMAL LOW (ref 8.9–10.3)
Chloride: 100 mmol/L (ref 98–111)
Creatinine, Ser: 0.93 mg/dL (ref 0.61–1.24)
GFR, Estimated: >60 mL/min (ref >60)
Glucose, Bld: 106 mg/dL — ABNORMAL HIGH (ref 70–99)
Potassium: 3.7 mmol/L (ref 3.5–5.1)
Sodium: 133 mmol/L — ABNORMAL LOW (ref 135–145)

## 2023-05-21 LAB — TROPONIN I (HIGH SENSITIVITY): Troponin I (High Sensitivity): 4 ng/L (ref <18)

## 2023-05-21 MED ORDER — CEPHALEXIN 500 MG PO CAPS
500.0000 mg | ORAL_CAPSULE | Freq: Once | ORAL | Status: AC
  Administered 2023-05-21: 500 mg via ORAL
  Filled 2023-05-21: qty 1

## 2023-05-21 MED ORDER — AZITHROMYCIN 250 MG PO TABS: ORAL_TABLET | ORAL | 0 refills | Status: AC

## 2023-05-21 MED ORDER — AZITHROMYCIN 500 MG PO TABS
500.0000 mg | ORAL_TABLET | Freq: Once | ORAL | Status: AC
  Administered 2023-05-21: 500 mg via ORAL
  Filled 2023-05-21: qty 1

## 2023-05-21 MED ORDER — HYDROCOD POLI-CHLORPHE POLI ER 10-8 MG/5ML PO SUER: 5.0000 mL | Freq: Two times a day (BID) | ORAL | 0 refills | Status: AC | PRN

## 2023-05-21 MED ORDER — ACETAMINOPHEN 500 MG PO TABS
1000.0000 mg | ORAL_TABLET | Freq: Once | ORAL | Status: AC
  Administered 2023-05-21: 1000 mg via ORAL
  Filled 2023-05-21: qty 2

## 2023-05-21 MED ORDER — CEPHALEXIN 500 MG PO CAPS: 500.0000 mg | ORAL_CAPSULE | Freq: Three times a day (TID) | ORAL | 0 refills | Status: AC

## 2023-05-21 NOTE — ED Notes (Signed)
See triage note  Presents with fever and cough  States he started getting this cough last week  was placed on meds  but states the cough is worse and becoming slightly productive febrile on arrival

## 2023-05-21 NOTE — ED Triage Notes (Addendum)
Pt presents to ER with c/o cough/cold sx that started 12/6.  Pt states his cough has been getting worse since then, and pt has been having a hard time sleeping.  Pt reports cough is dry in nature, and is having some increased sob, and chest soreness.  States he has taken home covid test and has been coming back negative.  Pt seen by tele-doc, and given rx for tessalon, but has not had any relief yet.  Pt is otherwise A&O x4 and in NAD.

## 2023-05-21 NOTE — ED Provider Notes (Signed)
[facility] [place] Provider Note    Event Date/Time   First MD Initiated Contact with Patient 05/21/23 [phone]     (approximate)   History   Cough and Shortness of Breath   HPI  Chris Spence. is a 52 y.o. male with history of cigarette usage who presents with complaints of cough, mild shortness of breath.  Patient reports cough developed 3 days ago has worsened over the weekend.  He did have a TeleDoc visit, was given Tessalon Perles and albuterol inhaler with little improvement.  Reports difficulty sleeping because of cough     Physical Exam   Triage Vital Signs: ED Triage Vitals  Encounter Vitals Group     BP 05/21/23 0527 (!) 182/101     Systolic BP Percentile --      Diastolic BP Percentile --      Pulse Rate 05/21/23 0527 100     Resp 05/21/23 0527 20     Temp 05/21/23 0527 (!) 101.9 F (38.8 C)     Temp src --      SpO2 05/21/23 0527 97 %     Weight 05/21/23 0528 96.2 kg (212 lb)     Height 05/21/23 0528 1.778 m (5\' 10" )     Head Circumference --      Peak Flow --      Pain Score 05/21/23 0528 5     Pain Loc --      Pain Education --      Exclude from Growth Chart --     Most recent vital signs: Vitals:   05/21/23 0527  BP: (!) 182/101  Pulse: 100  Resp: 20  Temp: (!) 101.9 F (38.8 C)  SpO2: 97%     General: Awake, no distress.  CV:  Good peripheral perfusion.  Mild tachycardia Resp:  Normal effort.  No tachypnea, no wheezing Abd:  No distention.  Other:     ED Results / Procedures / Treatments   Labs (all labs ordered are listed, but only abnormal results are displayed) Labs Reviewed  BASIC METABOLIC PANEL - Abnormal; Notable for the following components:      Result Value   Sodium 133 (*)    Glucose, Bld 106 (*)    Calcium 8.5 (*)    All other components within normal limits  RESP PANEL BY RT-PCR (RSV, FLU A&B, COVID)  RVPGX2  CBC  TROPONIN I (HIGH SENSITIVITY)  TROPONIN I (HIGH SENSITIVITY)     EKG  ED ECG  REPORT I, Jene Every, the attending physician, personally viewed and interpreted this ECG.  Date: 05/21/2023  Rhythm: Sinus tachycardia QRS Axis: normal Intervals: normal ST/T Wave abnormalities: normal Narrative Interpretation: no evidence of acute ischemia    RADIOLOGY Chest x-ray viewed interpret by me, right-sided opacity, showed patient in room    PROCEDURES:  Critical Care performed:   Procedures   MEDICATIONS ORDERED IN ED: Medications  cephALEXin (KEFLEX) capsule 500 mg (has no administration in time range)  azithromycin (ZITHROMAX) tablet 500 mg (has no administration in time range)  acetaminophen (TYLENOL) tablet 1,000 mg (1,000 mg Oral Given 05/21/23 0533)     IMPRESSION / MDM / ASSESSMENT AND PLAN / ED COURSE  I reviewed the triage vital signs and the nursing notes. Patient's presentation is most consistent with acute presentation with potential threat to life or bodily function.  Patient presents with cough, shortness of breath as above.  Found to be hypertensive and febrile with heart rate  of 100.  Oxygen saturations are normal.  No tachypnea  Differential includes viral illness, pneumonia  Lab work reviewed and overall reassuring, mild hyponatremia, normal troponin, normal white blood cell count.  Flu RSV COVID-negative.  Chest x-ray is consistent with likely early pneumonia.  Will start the patient on Keflex and Z-Pak, discussed return precautions at this time no indication for admission, patient agrees with this plan        FINAL CLINICAL IMPRESSION(S) / ED DIAGNOSES   Final diagnoses:  Community acquired pneumonia of right middle lobe of lung     Rx / DC Orders   ED Discharge Orders          Ordered    cephALEXin (KEFLEX) 500 MG capsule  3 times daily        05/21/23 0729    azithromycin (ZITHROMAX Z-PAK) 250 MG tablet        05/21/23 0729    chlorpheniramine-HYDROcodone (TUSSIONEX) 10-8 MG/5ML  Every 12 hours PRN         05/21/23 6578             Note:  This document was prepared using Dragon voice recognition software and may include unintentional dictation errors.   Jene Every, MD 05/21/23 [phone]

## 2023-05-22 ENCOUNTER — Telehealth: Payer: Self-pay

## 2023-05-22 NOTE — Transitions of Care (Post Inpatient/ED Visit) (Signed)
   05/22/2023  Name: Chris Spence. MRN: [id number] DOB: Feb 08, 1971  Today's TOC FU Call Status: Today's TOC FU Call Status:: Successful TOC FU Call Completed TOC FU Call Complete Date: 05/22/23 Patient's Name and Date of Birth confirmed.  Transition Care Management Follow-up Telephone Call Date of Discharge: 05/21/23 Discharge Facility: [facility]) Type of Discharge: Emergency Department Reason for ED Visit: Other: (pneumonia) How have you been since you were released from the hospital?: Better Any questions or concerns?: No  Items Reviewed: Did you receive and understand the discharge instructions provided?: Yes Any new allergies since your discharge?: No Dietary orders reviewed?: Yes Do you have support at home?: Yes People in Home: significant other  Medications Reviewed Today: Medications Reviewed Today     Reviewed by Karena Addison, LPN (Licensed Practical Nurse) on 05/22/23 at 1607  Med List Status: <None>   Medication Order Taking? Sig Documenting Provider Last Dose Status Informant  azithromycin (ZITHROMAX Z-PAK) 250 MG tablet [id number]  Take 2 tablets (500 mg) on  Day 1,  followed by 1 tablet (250 mg) once daily on Days 2 through 5. Jene Every, MD  Active   cephALEXin [facility]) 500 MG capsule [id number]  Take 1 capsule (500 mg total) by mouth 3 (three) times daily for 7 days. Jene Every, MD  Active   chlorpheniramine-HYDROcodone (TUSSIONEX) 10-8 MG/5ML [id number]  Take 5 mLs by mouth every 12 (twelve) hours as needed for up to 3 days for cough. Jene Every, MD  Active   Multiple Vitamins-Minerals (ONE-A-DAY [facility] [facility] FORMULA PO) [id number] No Take 1 tablet by mouth daily. [provider] Taking Active   sildenafil (VIAGRA) 100 MG tablet [id number]  Take 1 tablet (100 mg total) by mouth daily as needed for erectile dysfunction. Duanne Limerick, MD  Active             Home Care and Equipment/Supplies: Were Home  Health Services Ordered?: NA Any new equipment or medical supplies ordered?: NA  Functional Questionnaire: Do you need assistance with bathing/showering or dressing?: No Do you need assistance with meal preparation?: No Do you need assistance with eating?: No Do you have difficulty maintaining continence: No Do you need assistance with getting out of bed/getting out of a chair/moving?: No Do you have difficulty managing or taking your medications?: No  Follow up appointments reviewed: PCP Follow-up appointment confirmed?: Yes Date of PCP follow-up appointment?: 05/29/23 Follow-up Provider: [facility] Follow-up appointment confirmed?: NA Do you need transportation to your follow-up appointment?: No Do you understand care options if your condition(s) worsen?: Yes-patient verbalized understanding    SIGNATURE Karena Addison, LPN [facility] Nurse Health Advisor Direct Dial [phone]

## 2023-05-29 ENCOUNTER — Inpatient Hospital Stay: Payer: BC Managed Care – PPO | Admitting: Family Medicine

## 2023-08-21 ENCOUNTER — Telehealth: Payer: Self-pay | Admitting: Family Medicine

## 2023-08-21 NOTE — Telephone Encounter (Signed)
 Left voice mail to set up new pcp for a physical.

## 2023-08-27 ENCOUNTER — Inpatient Hospital Stay: Admission: RE | Admit: 2023-08-27 | Payer: BC Managed Care – PPO | Source: Ambulatory Visit

## 2024-01-10 DIAGNOSIS — H6091 Unspecified otitis externa, right ear: Secondary | ICD-10-CM | POA: Diagnosis not present

## 2024-01-10 DIAGNOSIS — F17210 Nicotine dependence, cigarettes, uncomplicated: Secondary | ICD-10-CM | POA: Diagnosis not present

## 2024-01-10 DIAGNOSIS — H9201 Otalgia, right ear: Secondary | ICD-10-CM | POA: Diagnosis not present

## 2024-01-15 ENCOUNTER — Ambulatory Visit: Payer: Self-pay

## 2024-01-15 ENCOUNTER — Ambulatory Visit: Admitting: Student

## 2024-01-15 NOTE — Telephone Encounter (Signed)
 FYI

## 2024-01-15 NOTE — Telephone Encounter (Signed)
 FYI Only or Action Required?: FYI only for provider.  Patient was last seen in primary care on 04/23/2023 by Joshua Cathryne BROCKS, MD.  Called Nurse Triage reporting Otalgia.  Symptoms began several days ago.  Interventions attempted: Prescription medications: steroid ear drops prescribed by ED and Rest, hydration, or home remedies.  Symptoms are: unchanged.  Triage Disposition: See Physician Within 24 Hours  Patient/caregiver understands and will follow disposition?: Yes  Copied from CRM [id number]. Topic: Clinical - Red Word Triage >> Jan 15, 2024  9:24 AM Marissa P wrote: Red Word that prompted transfer to Nurse Triage: Santina to the er about a week ago Thursday [facility] in [place]). The ear feels swollen, nothing draining out and lots of soreness. Slight pain as well. This is his right ear. Reason for Disposition  Earache  (Exceptions: Brief ear pain of lasting less than 60 minutes, or earache occurring during air travel.)  Answer Assessment - Initial Assessment Questions 1. LOCATION: Which ear is involved?     Right ear 2. ONSET: When did the ear pain start?      Started last Wednesday 3. SEVERITY: How bad is the pain?  (Scale 1-10; mild, moderate or severe)     3 out of 10 4. URI SYMPTOMS: Do you have a runny nose or cough?     no 5. FEVER: Do you have a fever? If Yes, ask: What is your temperature, how was it measured, and when did it start?     no 6. CAUSE: Have you been swimming recently?, How often do you use Q-TIPS?, Have you had any recent air travel or scuba diving?     Patient reports swimming three to four days prior to the start of ear pain.  7. OTHER SYMPTOMS: Do you have any other symptoms? (e.g., decreased hearing, dizziness, headache, stiff neck, vomiting)     Decreased hearing to right ear-patient reports feeling like his ear is swollen  Patient calling with continued ear pain and decreased hearing to his right ear. Patient was seen at ED  last Thursday. Patient is requesting to be seen today as he is off work. Patient scheduled for an acute appointment today at 11:00 AM  Protocols used: Rilla

## 2024-01-20 DIAGNOSIS — H6090 Unspecified otitis externa, unspecified ear: Secondary | ICD-10-CM | POA: Diagnosis not present

## 2024-01-20 DIAGNOSIS — H9201 Otalgia, right ear: Secondary | ICD-10-CM | POA: Diagnosis not present

## 2024-02-12 DIAGNOSIS — Z888 Allergy status to other drugs, medicaments and biological substances status: Secondary | ICD-10-CM | POA: Diagnosis not present

## 2024-02-12 DIAGNOSIS — F17210 Nicotine dependence, cigarettes, uncomplicated: Secondary | ICD-10-CM | POA: Diagnosis not present

## 2024-02-12 DIAGNOSIS — Z043 Encounter for examination and observation following other accident: Secondary | ICD-10-CM | POA: Diagnosis not present

## 2024-02-12 DIAGNOSIS — X509XXA Other and unspecified overexertion or strenuous movements or postures, initial encounter: Secondary | ICD-10-CM | POA: Diagnosis not present

## 2024-02-12 DIAGNOSIS — M25561 Pain in right knee: Secondary | ICD-10-CM | POA: Diagnosis not present

## 2024-02-14 DIAGNOSIS — M25561 Pain in right knee: Secondary | ICD-10-CM | POA: Diagnosis not present

## 2024-02-14 DIAGNOSIS — M25461 Effusion, right knee: Secondary | ICD-10-CM | POA: Diagnosis not present

## 2024-02-14 DIAGNOSIS — S83411A Sprain of medial collateral ligament of right knee, initial encounter: Secondary | ICD-10-CM | POA: Diagnosis not present

## 2024-02-20 DIAGNOSIS — J9801 Acute bronchospasm: Secondary | ICD-10-CM | POA: Diagnosis not present

## 2024-02-20 DIAGNOSIS — J209 Acute bronchitis, unspecified: Secondary | ICD-10-CM | POA: Diagnosis not present

## 2024-03-18 DIAGNOSIS — M25561 Pain in right knee: Secondary | ICD-10-CM | POA: Diagnosis not present

## 2024-03-18 DIAGNOSIS — S83411A Sprain of medial collateral ligament of right knee, initial encounter: Secondary | ICD-10-CM | POA: Diagnosis not present

## 2024-03-20 NOTE — Progress Notes (Signed)
 Chris E Rolison Jr.                                          MRN: [id number]   03/20/2024   The [facility] Quality Team Specialist reviewed this patient medical record for the purposes of chart review for care gap closure. The following were reviewed: chart review for care gap closure-colorectal cancer screening.    [facility] Quality Team

## 2024-04-14 ENCOUNTER — Telehealth: Payer: Self-pay

## 2024-04-14 NOTE — Telephone Encounter (Signed)
 Copied from CRM [id number]. Topic: Appointments - Transfer of Care >> Apr 14, 2024  1:21 PM Rachelle R wrote: Pt is requesting to transfer FROM: Chris Spence Pt is requesting to transfer TO: Chris Spence Reason for requested transfer: Provider Left It is the responsibility of the team the patient would like to transfer to [facility] Chris Spence) to reach out to the patient if for any reason this transfer is not acceptable.

## 2024-04-15 DIAGNOSIS — E119 Type 2 diabetes mellitus without complications: Secondary | ICD-10-CM | POA: Diagnosis not present

## 2024-04-15 DIAGNOSIS — R42 Dizziness and giddiness: Secondary | ICD-10-CM | POA: Diagnosis not present

## 2024-04-16 ENCOUNTER — Ambulatory Visit: Admitting: Physician Assistant

## 2024-04-16 ENCOUNTER — Encounter: Payer: Self-pay | Admitting: Physician Assistant

## 2024-04-16 VITALS — BP 138/80 | HR 81 | Temp 98.0°F | Ht 70.0 in | Wt 199.0 lb

## 2024-04-16 DIAGNOSIS — E782 Mixed hyperlipidemia: Secondary | ICD-10-CM | POA: Insufficient documentation

## 2024-04-16 DIAGNOSIS — Z7985 Long-term (current) use of injectable non-insulin antidiabetic drugs: Secondary | ICD-10-CM

## 2024-04-16 DIAGNOSIS — F17200 Nicotine dependence, unspecified, uncomplicated: Secondary | ICD-10-CM | POA: Insufficient documentation

## 2024-04-16 DIAGNOSIS — R918 Other nonspecific abnormal finding of lung field: Secondary | ICD-10-CM | POA: Insufficient documentation

## 2024-04-16 DIAGNOSIS — E1165 Type 2 diabetes mellitus with hyperglycemia: Secondary | ICD-10-CM | POA: Insufficient documentation

## 2024-04-16 DIAGNOSIS — N529 Male erectile dysfunction, unspecified: Secondary | ICD-10-CM | POA: Insufficient documentation

## 2024-04-16 MED ORDER — OZEMPIC (0.25 OR 0.5 MG/DOSE) 2 MG/1.5ML ~~LOC~~ SOPN: 0.2500 mg | PEN_INJECTOR | SUBCUTANEOUS | Status: DC

## 2024-04-16 NOTE — Progress Notes (Signed)
 Date:  04/16/2024   Name:  Chris E Gayton Jr.   DOB:  17-Jul-1970   MRN:  [id number]   Chief Complaint: Establish Care (Patient presents today to establish care. He would like to discuss his newly dx with type 2 diabetes. He went to ER yesterday and blood sugar was 488. He is concerned about this because this is all new to him.)  HPI  Chris Spence is a very pleasant 53 year old male with history of tobacco use disorder, HLD, ED, and newly diagnosed diabetes who presents new to me today as a transfer of care from my recently retired animator Dr. Cathryne Molt.  For the last couple weeks, he has been experiencing polydipsia and polyuria with associated cognitive fogginess ultimately prompting trip to [facility] emergency room yesterday where his glucose was over 400 and A1c was 10%.  Unfortunately I cannot see these records.  He was given several bags of electrolytes then discharged with outpatient metformin when his glucose was less than 400.  He brings me a glucometer with numerous readings, all of which are 300-450 mg/dL.  He believes metformin has not helped much.  Prior to this he had no diagnosis of diabetes or prediabetes, denies frequent consumption of sweets, and most recent BMP from 05/21/2023 had a glucose of 106.  Chart review shows that over the last 3 years, glucose has never been recorded as higher than 127 (which was nonfasting).  He is down about 12 pounds over the last year.  He does endorse family history of diabetes in his uncle and grandmother.  Medication list has been reviewed and updated.  Current Meds  Medication Sig   Multiple Vitamins-Minerals (ONE-A-DAY MENS HEALTH FORMULA PO) Take 1 tablet by mouth daily.   OZEMPIC, 0.25 OR 0.5 MG/DOSE, 2 MG/1.5ML SOPN Inject 0.25 mg into the skin once a week.   sildenafil  (VIAGRA ) 100 MG tablet Take 1 tablet (100 mg total) by mouth daily as needed for erectile dysfunction.     Review of Systems  Patient Active Problem List    Diagnosis Date Noted   Tobacco use disorder 04/16/2024   Hyperglycemia due to diabetes mellitus (HCC) 04/16/2024   Vasculogenic erectile dysfunction 04/16/2024   Mixed hyperlipidemia 04/16/2024   Multiple lung nodules 04/16/2024    Allergies  Allergen Reactions   Tramadol Nausea And Vomiting     There is no immunization history on file for this patient.  Past Surgical History:  Procedure Laterality Date   APPENDECTOMY     CERVICAL FUSION     C-6 and C-7   EYE SURGERY     R) eye- growth removed  L) eye- removed foreign object    Social History   Tobacco Use   Smoking status: Every Day    Current packs/day: 1.00    Average packs/day: 1 pack/day for 40.0 years (40.0 ttl pk-yrs)    Types: Cigarettes   Smokeless tobacco: Never   Tobacco comments:    patches and pills discussed  Vaping Use   Vaping status: Never Used  Substance Use Topics   Alcohol use: Yes    Comment: occassionally   Drug use: No    Family History  Problem Relation Age of Onset   Cancer Mother    Cancer Sister    Diabetes type II Maternal Grandmother    Diabetes type II Maternal Uncle         04/23/2023    9:21 AM 06/16/2022    8:20 AM 03/10/2022  11:05 AM 03/06/2022    9:50 AM  GAD 7 : Generalized Anxiety Score  Nervous, Anxious, on Edge 0 0 0 0  Control/stop worrying 0 0 0 0  Worry too much - different things 0 0 0 0  Trouble relaxing 3 0 0 0  Restless 0 0 0 0  Easily annoyed or irritable 2 0 0 0  Afraid - awful might happen 0 0 0 0  Total GAD 7 Score 5 0 0 0  Anxiety Difficulty Very difficult Not difficult at all Not difficult at all Not difficult at all       04/23/2023    9:21 AM 06/16/2022    8:19 AM 03/10/2022   11:05 AM  Depression screen PHQ 2/9  Decreased Interest 1 0 0  Down, Depressed, Hopeless 0 0 0  PHQ - 2 Score 1 0 0  Altered sleeping 3 0 0  Tired, decreased energy 3 0 0  Change in appetite 0 0 0  Feeling bad or failure about yourself  0 0 0  Trouble  concentrating 0 0 0  Moving slowly or fidgety/restless 0 0 0  Suicidal thoughts 0 0 0  PHQ-9 Score 7 0 0  Difficult doing work/chores Not difficult at all Not difficult at all Not difficult at all    BP Readings from Last 3 Encounters:  04/16/24 138/80  05/21/23 (!) 170/90  04/23/23 122/78    Wt Readings from Last 3 Encounters:  04/16/24 199 lb (90.3 kg)  05/21/23 212 lb (96.2 kg)  04/23/23 208 lb (94.3 kg)    BP 138/80   Pulse 81   Temp 98 F (36.7 C) (Oral)   Ht 5' 10 (1.778 m)   Wt 199 lb (90.3 kg)   SpO2 94%   BMI 28.55 kg/m   Physical Exam Vitals and nursing note reviewed.  Constitutional:      Appearance: Normal appearance.  Cardiovascular:     Rate and Rhythm: Normal rate.  Pulmonary:     Effort: Pulmonary effort is normal.  Abdominal:     General: There is no distension.  Musculoskeletal:        General: Normal range of motion.  Skin:    General: Skin is warm and dry.  Neurological:     Mental Status: He is alert and oriented to person, place, and time.     Gait: Gait is intact.  Psychiatric:        Mood and Affect: Mood and affect normal.     Recent Labs     Component Value Date/Time   NA 133 (L) 05/21/2023 0531   NA 138 03/06/2022 1036   NA 141 03/22/2014 1908   K 3.7 05/21/2023 0531   K 3.6 03/22/2014 1908   CL 100 05/21/2023 0531   CL 109 (H) 03/22/2014 1908   CO2 26 05/21/2023 0531   CO2 26 03/22/2014 1908   GLUCOSE 106 (H) 05/21/2023 0531   GLUCOSE 80 03/22/2014 1908   BUN 12 05/21/2023 0531   BUN 19 03/06/2022 1036   BUN 15 03/22/2014 1908   CREATININE 0.93 05/21/2023 0531   CREATININE 1.18 03/22/2014 1908   CALCIUM  8.5 (L) 05/21/2023 0531   CALCIUM  8.4 (L) 03/22/2014 1908   ALBUMIN 4.4 03/06/2022 1036   GFRNONAA >60 05/21/2023 0531   GFRNONAA >60 03/22/2014 1908   GFRAA >60 03/22/2014 1908    Lab Results  Component Value Date   WBC 10.0 05/21/2023   HGB 15.7 05/21/2023   HCT  45.2 05/21/2023   MCV 82.8 05/21/2023   PLT  186 05/21/2023   No results found for: HGBA1C Lab Results  Component Value Date   CHOL 240 (H) 06/16/2022   HDL 32 (L) 06/16/2022   LDLCALC 167 (H) 06/16/2022   TRIG 204 (H) 06/16/2022   CHOLHDL 7.5 06/16/2022   No results found for: TSH    Assessment and Plan:  Hyperglycemia due to diabetes mellitus (HCC) Assessment & Plan: This is certainly a peculiar presentation in this 53 year old patient with a BMI of 28 and sudden onset of severe diabetes.  Given this history, suspect type 1.5.  Will confirm with LADA panel.   In the meantime, continue metformin and add on Ozempic.  We discussed that if type 1.5, it does not typically respond very well to oral medications, and may even require insulin.  Continue regular glucose checks with home glucometer.  We discussed risks, benefits, mechanism of action, and common side effects from GLP-1 RA.  No family history of thyroid cancer or MEN2.  No personal history of gallstones or pancreatitis.  Administration instructions reviewed and demonstrated to patient today.  If well-tolerated at our follow-up, will submit prior authorization.  Advised the importance of adequate protein intake on this medication as well as resistance training which will not only improve insulin sensitivity but also build muscle mass and reduce risk of atrophy.  I recommended minimum 20 min sessions 3x/wk. Advised importance of portion control and slower eating on this medication to minimize GI side effects. Avoid high fat foods as these may cause discomfort.   Patient was given a carbohydrate guide and we discussed basic diabetes education.  Will also be referring to registered dietitian and endocrinology.  Orders: -     Comprehensive metabolic panel with GFR -     C-peptide -     Diabetes Autoimmune Profile -     TSH -     Ozempic (0.25 or 0.5 MG/DOSE); Inject 0.25 mg into the skin once a week.  Dispense: 1.5 mL -     Amb ref to Medical Nutrition Therapy-MNT -      Ambulatory referral to Endocrinology   I personally spent a total of 45 minutes in the care of the patient today including preparing to see the patient, performing a medically appropriate exam/evaluation, counseling and educating, placing orders, referring and communicating with other health care professionals, documenting clinical information in the EHR, communicating results, and coordinating care.   Return in about 2 weeks (around 04/30/2024) for OV f/u diabetes.    Rolan Hoyle, PA-C, DMSc, Nutritionist [facility] [phone]

## 2024-04-16 NOTE — Assessment & Plan Note (Signed)
 This is certainly a peculiar presentation in this 53 year old patient with a BMI of 28 and sudden onset of severe diabetes.  Given this history, suspect type 1.5.  Will confirm with LADA panel.   In the meantime, continue metformin and add on Ozempic.  We discussed that if type 1.5, it does not typically respond very well to oral medications, and may even require insulin.  Continue regular glucose checks with home glucometer.  We discussed risks, benefits, mechanism of action, and common side effects from GLP-1 RA.  No family history of thyroid cancer or MEN2.  No personal history of gallstones or pancreatitis.  Administration instructions reviewed and demonstrated to patient today.  If well-tolerated at our follow-up, will submit prior authorization.  Advised the importance of adequate protein intake on this medication as well as resistance training which will not only improve insulin sensitivity but also build muscle mass and reduce risk of atrophy.  I recommended minimum 20 min sessions 3x/wk. Advised importance of portion control and slower eating on this medication to minimize GI side effects. Avoid high fat foods as these may cause discomfort.   Patient was given a carbohydrate guide and we discussed basic diabetes education.  Will also be referring to registered dietitian and endocrinology.

## 2024-04-18 ENCOUNTER — Ambulatory Visit: Payer: Self-pay | Admitting: *Deleted

## 2024-04-18 ENCOUNTER — Other Ambulatory Visit: Payer: Self-pay

## 2024-04-18 ENCOUNTER — Telehealth: Payer: Self-pay | Admitting: Physician Assistant

## 2024-04-18 ENCOUNTER — Ambulatory Visit: Admitting: Physician Assistant

## 2024-04-18 ENCOUNTER — Telehealth: Payer: Self-pay

## 2024-04-18 ENCOUNTER — Other Ambulatory Visit: Payer: Self-pay | Admitting: Physician Assistant

## 2024-04-18 ENCOUNTER — Encounter: Payer: Self-pay | Admitting: Physician Assistant

## 2024-04-18 DIAGNOSIS — E1165 Type 2 diabetes mellitus with hyperglycemia: Secondary | ICD-10-CM

## 2024-04-18 MED ORDER — LANTUS SOLOSTAR 100 UNIT/ML ~~LOC~~ SOPN: 10.0000 [IU] | PEN_INJECTOR | Freq: Every day | SUBCUTANEOUS | 99 refills | Status: DC

## 2024-04-18 MED ORDER — INSULIN PEN NEEDLE 32G X 4 MM MISC: 1.0000 | Freq: Every day | 1 refills | Status: DC

## 2024-04-18 MED ORDER — LANTUS SOLOSTAR 100 UNIT/ML ~~LOC~~ SOPN: 10.0000 [IU] | PEN_INJECTOR | Freq: Every day | SUBCUTANEOUS | 99 refills | Status: AC

## 2024-04-18 MED ORDER — INSULIN PEN NEEDLE 32G X 4 MM MISC: 1.0000 | Freq: Every day | 1 refills | Status: AC

## 2024-04-18 NOTE — Telephone Encounter (Signed)
 Called pt let him know that medication was sent in. There was already a appt scheduled for the pt to come in today. Told pt I will cancel his appt for today. Pt stated he took off of work today for his appointment. Pt wants a note to miss work today due to him being worried about his blood sugars.  Pt also said his vision is getting worse. Told pt to call his eye doctor so they can do a diabetic eye exam. Pt verbalized understanding.  KP

## 2024-04-18 NOTE — Telephone Encounter (Signed)
 Please check PA for CGM (Freestyle Libre 3 Plus). He is newly diagnosed diabetic with documented hyperglycemia who is now taking insulin. Thanks!

## 2024-04-18 NOTE — Telephone Encounter (Signed)
 Copied from CRM [id number]. Topic: Clinical - Prescription Issue >> Apr 18, 2024 11:30 AM Victoria A wrote: Reason for CRM: Patient said that his Insulin [id number] [place] [place] - [address] [place] [address] Phone: [phone] Fax: [phone] Hours: Not open 24 hours >> Apr 18, 2024 12:37 PM Ivette P wrote: Pt called in and was told that [facility]. Per chart looks [facility] and ordeer was modified. Advised pt. Pt will call and check with pharmacy    Date/Time Action Taken User Additional Information  04/18/24 0903 Sign Person, Steva SAILOR, CMA Modify from Nmizm:[id number]; Ordering Mode: Rx Refill

## 2024-04-18 NOTE — Telephone Encounter (Signed)
 FYI Only or Action Required?: FYI only for provider: appointment scheduled on 11/7.  Patient was last seen in primary care on 04/16/2024 by Manya Toribio SQUIBB, PA.  Called Nurse Triage reporting Hyperglycemia.  Symptoms began today.  Interventions attempted: Prescription medications: Ozempic, metformin  and Dietary changes.  Symptoms are: Patient concerned with elevated numbers.  Triage Disposition: Call PCP Now  Patient/caregiver understands and will follow disposition?: Yes  Copied from CRM #[id number]. Topic: Clinical - Red Word Triage >> Apr 18, 2024  8:12 AM Chris Spence wrote: Chris Spence that prompted transfer to Nurse Triage: patient called stated his sugar was 380 and ER sent him home with his sugar was 394. On Wednesday norning his numbers were 378,469, 382, 345, 400 and 310at bedtine. 368, 310, 309, 290.303 Wednesday morning and this morning 326 and now 297. He took Ozempic at 5:30an on Wednesday norming, Reason for Disposition  [1] Blood glucose > 300 mg/dL (83.2 mmol/L) AND [7] two or more times in a row  Answer Assessment - Initial Assessment Questions 1. BLOOD GLUCOSE: What is your blood glucose level?      297- now, fasting 326 5:15 am 2. ONSET: When did you check the blood glucose?     Newly diagnosed 3. USUAL RANGE: What is your glucose level usually? (e.g., usual fasting morning value, usual evening value)     300 range  5. TYPE 1 or 2:  Do you know what type of diabetes you have?  (e.g., Type 1, Type 2, Gestational; doesn'Spence know)      Newly diagnosed 6. INSULIN: Do you take insulin? What type of insulin(s) do you use? What is the mode of delivery? (syringe, pen; injection or pump)?      Ozempic 7. DIABETES PILLS: Do you take any pills for your diabetes? If Yes, ask: Have you missed taking any pills recently?     metformin 8. OTHER SYMPTOMS: Do you have any symptoms? (e.g., fever, frequent urination, difficulty breathing, dizziness, weakness, vomiting)      Some dizziness, blurred vision  Protocols used: Diabetes - High Blood Sugar-A-AH

## 2024-04-22 ENCOUNTER — Other Ambulatory Visit (HOSPITAL_COMMUNITY): Payer: Self-pay

## 2024-04-22 ENCOUNTER — Telehealth: Payer: Self-pay

## 2024-04-22 ENCOUNTER — Other Ambulatory Visit: Payer: Self-pay | Admitting: Physician Assistant

## 2024-04-22 ENCOUNTER — Telehealth: Payer: Self-pay | Admitting: Pharmacy Technician

## 2024-04-22 DIAGNOSIS — E1165 Type 2 diabetes mellitus with hyperglycemia: Secondary | ICD-10-CM

## 2024-04-22 MED ORDER — DEXCOM G7 SENSOR MISC: 1.0000 | 5 refills | Status: DC

## 2024-04-22 NOTE — Telephone Encounter (Signed)
 PA request has been Received. New Encounter has been or will be created for follow up. For additional info see Pharmacy Prior Auth telephone encounter from 04/22/2024.

## 2024-04-22 NOTE — Telephone Encounter (Signed)
 Pharmacy Patient Advocate Encounter   Received notification from Pt Calls Messages that prior authorization for Freestyle Libre 3 Plus is required/requested.   Insurance verification completed.   The patient is insured through [employer].   Per test claim:  DEXCOM G7 is preferred by the insurance.  If suggested medication is appropriate, Please send in a new RX and discontinue this one. If not, please advise as to why it's not appropriate so that we may request a Prior Authorization. Please note, some preferred medications may still require a PA.  If the suggested medications have not been trialed and there are no contraindications to their use, the PA will not be submitted, as it will not be approved.  Freestyle is drug exclusion but Dexcom G7 is what is preferred and went through and copay is $40.00

## 2024-04-22 NOTE — Telephone Encounter (Signed)
 Patient returned call from the office. Reached out to office and the rep who called was gone for the day the time, so will call back when available to follow up with patient.

## 2024-04-22 NOTE — Telephone Encounter (Signed)
 Copied from CRM [id number]. Topic: Clinical - Request for Lab/Test Order >> Apr 22, 2024  9:20 AM Myrick T wrote: Reason for CRM: Temeka from [facility] requested a copy of patients most recent labs that was done on 11/5. Referral was sent to [facility] without the labs. Please fax lab results to [phone] attn  [facility]

## 2024-04-22 NOTE — Telephone Encounter (Signed)
 Please send medical records (labs ) to [phone] attn Tameka. Thank you

## 2024-04-23 ENCOUNTER — Telehealth: Payer: Self-pay

## 2024-04-23 NOTE — Telephone Encounter (Signed)
 Has not taken insulin since Saturday night. Said his levels are like a roller coaster. Has had a reading of 230 today. Told pt to continue to what he is doing with his insulin.  KP

## 2024-04-23 NOTE — Telephone Encounter (Signed)
 Spoke to patient today document on another note. Labs faxed.  KP

## 2024-04-23 NOTE — Telephone Encounter (Signed)
 Copied from CRM [id number]. Topic: Clinical - Request for Lab/Test Order >> Apr 22, 2024  9:20 AM Myrick T wrote: Reason for CRM: Temeka from [facility] requested a copy of patients most recent labs that was done on 11/5. Referral was sent to [facility] without the labs. Please fax lab results to [phone] beecher Peach >> Apr 23, 2024  2:35 PM China J wrote: Patient is calling back to see if Steva is available to speak to since he did not receive a call back from yesterday. I was able to warm transfer him to CAL. >> Apr 23, 2024  9:32 AM Mia F wrote: Pt returning office call. Per CAL, CMA was in a room with pt and will call back

## 2024-04-23 NOTE — Telephone Encounter (Signed)
 Copied from CRM [id number]. Topic: Clinical - Request for Lab/Test Order >> Apr 22, 2024  9:20 AM Myrick T wrote: Reason for CRM: Temeka from [facility] requested a copy of patients most recent labs that was done on 11/5. Referral was sent to [facility] without the labs. Please fax lab results to [phone] beecher Peach >> Apr 23, 2024  9:32 AM Logan F wrote: Pt returning office call. Per CAL, CMA was in a room with pt and will call back

## 2024-04-23 NOTE — Telephone Encounter (Signed)
 11/5 labs sent. Partial labs due to not being completely done by [facility].  KP

## 2024-04-24 ENCOUNTER — Encounter: Payer: Self-pay | Admitting: Family Medicine

## 2024-04-29 ENCOUNTER — Other Ambulatory Visit: Payer: Self-pay

## 2024-04-30 ENCOUNTER — Encounter: Payer: Self-pay | Admitting: Physician Assistant

## 2024-04-30 ENCOUNTER — Telehealth: Payer: Self-pay | Admitting: Physician Assistant

## 2024-04-30 ENCOUNTER — Ambulatory Visit: Admitting: Physician Assistant

## 2024-04-30 VITALS — BP 138/84 | HR 79 | Temp 98.0°F | Ht 70.0 in | Wt 198.0 lb

## 2024-04-30 DIAGNOSIS — Z7985 Long-term (current) use of injectable non-insulin antidiabetic drugs: Secondary | ICD-10-CM

## 2024-04-30 DIAGNOSIS — M19011 Primary osteoarthritis, right shoulder: Secondary | ICD-10-CM | POA: Diagnosis not present

## 2024-04-30 DIAGNOSIS — E782 Mixed hyperlipidemia: Secondary | ICD-10-CM

## 2024-04-30 DIAGNOSIS — M65322 Trigger finger, left index finger: Secondary | ICD-10-CM | POA: Insufficient documentation

## 2024-04-30 DIAGNOSIS — N529 Male erectile dysfunction, unspecified: Secondary | ICD-10-CM

## 2024-04-30 DIAGNOSIS — Z7984 Long term (current) use of oral hypoglycemic drugs: Secondary | ICD-10-CM

## 2024-04-30 DIAGNOSIS — M65331 Trigger finger, right middle finger: Secondary | ICD-10-CM | POA: Insufficient documentation

## 2024-04-30 DIAGNOSIS — E1165 Type 2 diabetes mellitus with hyperglycemia: Secondary | ICD-10-CM | POA: Diagnosis not present

## 2024-04-30 DIAGNOSIS — M19012 Primary osteoarthritis, left shoulder: Secondary | ICD-10-CM

## 2024-04-30 LAB — POCT GLYCOSYLATED HEMOGLOBIN (HGB A1C): Hemoglobin A1C: 9.6 % — AB (ref 4.0–5.6)

## 2024-04-30 MED ORDER — SILDENAFIL CITRATE 100 MG PO TABS: 100.0000 mg | ORAL_TABLET | Freq: Every day | ORAL | 11 refills | Status: AC | PRN

## 2024-04-30 MED ORDER — DEXCOM G7 SENSOR MISC: 1.0000 | 5 refills | Status: AC

## 2024-04-30 MED ORDER — ROSUVASTATIN CALCIUM 5 MG PO TABS: 5.0000 mg | ORAL_TABLET | Freq: Every day | ORAL | 0 refills | Status: AC

## 2024-04-30 MED ORDER — MELOXICAM 15 MG PO TABS: 15.0000 mg | ORAL_TABLET | Freq: Every day | ORAL | 0 refills | Status: DC

## 2024-04-30 NOTE — Progress Notes (Signed)
 Date:  04/30/2024   Name:  Chris E Vig Jr.   DOB:  Oct 19, 1970   MRN:  [id number]   Chief Complaint: Diabetes  HPI  Chris Spence presents for short-interval 2-wk f/u on newly discovered diabetes who established care with me on 04/16/24. Due to the acute onset of diabetes, last visit we screened for LADA/type 1.5 and this screening was largely negative although we are still waiting on one particular autoantibody - ZNT8.   He is presently using Ozempic and metformin, seems to be tolerating both fairly well. He completed his 2-week wear of Freestyle Libre, only used insulin twice because he largely remained between 100-200 mg/dL. Insurance had us  switch to Dexcom G7 which is preferred, though he has not yet picked up his new sensors. Referral to [facility] has been placed, awaiting appointment - they are waiting on his labs from us . I referred to dietician where he has an appointment 05/29/24.   Has slightly decreased smoking, still somewhere between 0.75-1 ppd.   Mentions his chronic bilateral shoulder pain. Was taking ibuprofen PRN but learned he should use caution with ibuprofen and diabetes. Has not tried Voltaren.   Also has trigger finger of left 2nd and right 3rd fingers.   Requests sildenafil  refill.   Medication list has been reviewed and updated.  Current Meds  Medication Sig   insulin glargine (LANTUS SOLOSTAR) 100 UNIT/ML Solostar Pen Inject 10 Units into the skin daily. Increase by 5 units each week until fasting blood sugar is consistently <200 mg/dL. Do not exceed 25 units daily unless you discuss with healthcare provider.   Insulin Pen Needle 32G X 4 MM MISC 1 each by Does not apply route daily.   meloxicam  (MOBIC ) 15 MG tablet Take 1 tablet (15 mg total) by mouth daily.   metFORMIN (GLUCOPHAGE) 500 MG tablet Take 500 mg by mouth 2 (two) times daily.   Multiple Vitamins-Minerals (ONE-A-DAY MENS HEALTH FORMULA PO) Take 1 tablet by mouth daily.   OZEMPIC, 0.25 OR 0.5 MG/DOSE,  2 MG/1.5ML SOPN Inject 0.25 mg into the skin once a week.   rosuvastatin (CRESTOR) 5 MG tablet Take 1 tablet (5 mg total) by mouth daily.   [DISCONTINUED] Continuous Glucose Sensor (DEXCOM G7 SENSOR) MISC Place 1 each onto the skin as directed. Change sensor every 10 days.   [DISCONTINUED] sildenafil  (VIAGRA ) 100 MG tablet Take 1 tablet (100 mg total) by mouth daily as needed for erectile dysfunction.     Review of Systems  Patient Active Problem List   Diagnosis Date Noted   Bilateral shoulder region arthritis 04/30/2024   Trigger middle finger of right hand 04/30/2024   Trigger index finger of left hand 04/30/2024   Tobacco use disorder 04/16/2024   Type 2 diabetes mellitus with hyperglycemia, without long-term current use of insulin (HCC) 04/16/2024   Vasculogenic erectile dysfunction 04/16/2024   Mixed hyperlipidemia 04/16/2024   Multiple lung nodules 04/16/2024    Allergies  Allergen Reactions   Tramadol Nausea And Vomiting     There is no immunization history on file for this patient.  Past Surgical History:  Procedure Laterality Date   APPENDECTOMY     CERVICAL FUSION     C-6 and C-7   EYE SURGERY     R) eye- growth removed  L) eye- removed foreign object    Social History   Tobacco Use   Smoking status: Every Day    Current packs/day: 1.00    Average packs/day: 1 pack/day for  40.0 years (40.0 ttl pk-yrs)    Types: Cigarettes   Smokeless tobacco: Never   Tobacco comments:    patches and pills discussed  Vaping Use   Vaping status: Never Used  Substance Use Topics   Alcohol use: Yes    Comment: occassionally   Drug use: No    Family History  Problem Relation Age of Onset   Cancer Mother    Cancer Sister    Diabetes type II Maternal Grandmother    Diabetes type II Maternal Uncle         04/30/2024    8:16 AM 04/23/2023    9:21 AM 06/16/2022    8:20 AM 03/10/2022   11:05 AM  GAD 7 : Generalized Anxiety Score  Nervous, Anxious, on Edge 2 0 0 0   Control/stop worrying 2 0 0 0  Worry too much - different things 2 0 0 0  Trouble relaxing 2 3 0 0  Restless 0 0 0 0  Easily annoyed or irritable 1 2 0 0  Afraid - awful might happen 0 0 0 0  Total GAD 7 Score 9 5 0 0  Anxiety Difficulty Very difficult Very difficult Not difficult at all Not difficult at all       04/30/2024    8:16 AM 04/23/2023    9:21 AM 06/16/2022    8:19 AM  Depression screen PHQ 2/9  Decreased Interest 0 1 0  Down, Depressed, Hopeless 0 0 0  PHQ - 2 Score 0 1 0  Altered sleeping  3 0  Tired, decreased energy  3 0  Change in appetite  0 0  Feeling bad or failure about yourself   0 0  Trouble concentrating  0 0  Moving slowly or fidgety/restless  0 0  Suicidal thoughts  0 0  PHQ-9 Score  7  0   Difficult doing work/chores  Not difficult at all Not difficult at all     Data saved with a previous flowsheet row definition    BP Readings from Last 3 Encounters:  04/30/24 138/84  04/16/24 138/80  05/21/23 (!) 170/90    Wt Readings from Last 3 Encounters:  04/30/24 198 lb (89.8 kg)  04/16/24 199 lb (90.3 kg)  05/21/23 212 lb (96.2 kg)    BP 138/84   Pulse 79   Temp 98 F (36.7 C)   Ht 5' 10 (1.778 m)   Wt 198 lb (89.8 kg)   SpO2 97%   BMI 28.41 kg/m   Physical Exam Vitals and nursing note reviewed.  Constitutional:      Appearance: Normal appearance.  Cardiovascular:     Rate and Rhythm: Normal rate.  Pulmonary:     Effort: Pulmonary effort is normal.  Abdominal:     General: There is no distension.  Musculoskeletal:        General: Normal range of motion.     Comments: Trigger finger noted of left 2nd finger and right 3rd finger  Skin:    General: Skin is warm and dry.  Neurological:     Mental Status: He is alert and oriented to person, place, and time.     Gait: Gait is intact.  Psychiatric:        Mood and Affect: Mood and affect normal.     Recent Labs     Component Value Date/Time   NA 133 (L) 05/21/2023 0531   NA  138 03/06/2022 1036   NA 141 03/22/2014 1908  K 3.7 05/21/2023 0531   K 3.6 03/22/2014 1908   CL 100 05/21/2023 0531   CL 109 (H) 03/22/2014 1908   CO2 26 05/21/2023 0531   CO2 26 03/22/2014 1908   GLUCOSE 106 (H) 05/21/2023 0531   GLUCOSE 80 03/22/2014 1908   BUN 12 05/21/2023 0531   BUN 19 03/06/2022 1036   BUN 15 03/22/2014 1908   CREATININE 0.93 05/21/2023 0531   CREATININE 1.18 03/22/2014 1908   CALCIUM  8.5 (L) 05/21/2023 0531   CALCIUM  8.4 (L) 03/22/2014 1908   ALBUMIN 4.4 03/06/2022 1036   GFRNONAA >60 05/21/2023 0531   GFRNONAA >60 03/22/2014 1908   GFRAA >60 03/22/2014 1908    Lab Results  Component Value Date   WBC 10.0 05/21/2023   HGB 15.7 05/21/2023   HCT 45.2 05/21/2023   MCV 82.8 05/21/2023   PLT 186 05/21/2023   Lab Results  Component Value Date   HGBA1C 9.6 (A) 04/30/2024   Lab Results  Component Value Date   CHOL 240 (H) 06/16/2022   HDL 32 (L) 06/16/2022   LDLCALC 167 (H) 06/16/2022   TRIG 204 (H) 06/16/2022   CHOLHDL 7.5 06/16/2022   No results found for: TSH    Assessment and Plan:  Type 2 diabetes mellitus with hyperglycemia, without long-term current use of insulin  (HCC) Assessment & Plan: LADA panel largely negative, those still waiting on ZNT8 antibody. For now, presume type 2. We discussed smoking and red meat consumption increase risk, so he should work to reduce these.   Continue metformin and Ozempic . He has basal insulin  only for use if sugars creeping over 200 and staying there.  Continue use of Dexcom G7  Will also be seeing registered dietitian and endocrinology soon.  Begin rosuvastatin  for ASCVD risk reduction in this patient with HLD, smoking history, and now DM2. He is willing to try it.   Orders: -     POCT glycosylated hemoglobin (Hb A1C) -     Rosuvastatin  Calcium ; Take 1 tablet (5 mg total) by mouth daily.  Dispense: 30 tablet; Refill: 0 -     Dexcom G7 Sensor; Place 1 each onto the skin as directed. Change  sensor every 10 days.  Dispense: 3 each; Refill: 5  Vasculogenic erectile dysfunction, unspecified vasculogenic erectile dysfunction type Assessment & Plan: Refill sildenafil  today.  Orders: -     Sildenafil  Citrate; Take 1 tablet (100 mg total) by mouth daily as needed for erectile dysfunction.  Dispense: 10 tablet; Refill: 11  Bilateral shoulder region arthritis Assessment & Plan: Try meloxicam  for the next 2-4 weeks. Can be used in combination with topical diclofenace as well which I encouraged him to try on his shoulders and/or fingers. If not improving, plan for follow up with my sports med colleague Dr. Selinda Ku.   Orders: -     Meloxicam ; Take 1 tablet (15 mg total) by mouth daily.  Dispense: 30 tablet; Refill: 0  Trigger middle finger of right hand Assessment & Plan: Try meloxicam  for the next 2-4 weeks. Can be used in combination with topical diclofenace as well which I encouraged him to try on his shoulders and/or fingers. If not improving, plan for follow up with my sports med colleague Dr. Selinda Ku.   Orders: -     Meloxicam ; Take 1 tablet (15 mg total) by mouth daily.  Dispense: 30 tablet; Refill: 0  Trigger index finger of left hand Assessment & Plan: Try meloxicam  for the next 2-4 weeks. Can be  used in combination with topical diclofenace as well which I encouraged him to try on his shoulders and/or fingers. If not improving, plan for follow up with my sports med colleague Dr. Selinda Ku.   Orders: -     Meloxicam ; Take 1 tablet (15 mg total) by mouth daily.  Dispense: 30 tablet; Refill: 0  Mixed hyperlipidemia Assessment & Plan: Begin rosuvastatin  for ASCVD risk reduction in this patient with HLD, smoking history, and now DM2. He is willing to try it.       Return in about 4 weeks (around 05/28/2024) for Fasting CPE.    Rolan Hoyle, PA-C, DMSc, Nutritionist [facility] [phone]

## 2024-04-30 NOTE — Telephone Encounter (Signed)
 Still pending. It can take up to 15 days once the sample is received.  KP

## 2024-04-30 NOTE — Assessment & Plan Note (Signed)
 Try meloxicam  for the next 2-4 weeks. Can be used in combination with topical diclofenace as well which I encouraged him to try on his shoulders and/or fingers. If not improving, plan for follow up with my sports med colleague Dr. Selinda Ku.

## 2024-04-30 NOTE — Telephone Encounter (Signed)
 Copied from CRM [id number]. Topic: Referral - Status >> Apr 30, 2024  9:10 AM Victoria B wrote: Reason for CRM: tamila fro [facility] called in about info needed for referral. She states they need lab info pertaining, to 11/05 of diabetes autoimmune profile. Fx is  [phone]

## 2024-04-30 NOTE — Assessment & Plan Note (Signed)
 Begin rosuvastatin for ASCVD risk reduction in this patient with HLD, smoking history, and now DM2. He is willing to try it.

## 2024-04-30 NOTE — Assessment & Plan Note (Addendum)
 LADA panel largely negative, those still waiting on ZNT8 antibody. For now, presume type 2. We discussed smoking and red meat consumption increase risk, so he should work to reduce these.   Continue metformin and Ozempic. He has basal insulin only for use if sugars creeping over 200 and staying there.  Continue use of Dexcom G7  Will also be seeing registered dietitian and endocrinology soon.  Begin rosuvastatin for ASCVD risk reduction in this patient with HLD, smoking history, and now DM2. He is willing to try it.

## 2024-04-30 NOTE — Assessment & Plan Note (Signed)
 Refill sildenafil today

## 2024-05-01 ENCOUNTER — Ambulatory Visit: Payer: Self-pay | Admitting: Physician Assistant

## 2024-05-01 LAB — COMPREHENSIVE METABOLIC PANEL WITH GFR
ALT: 32 IU/L (ref 0–44)
AST: 18 IU/L (ref 0–40)
Albumin: 4.9 g/dL (ref 3.8–4.9)
Alkaline Phosphatase: 137 IU/L — ABNORMAL HIGH (ref 47–123)
BUN/Creatinine Ratio: 17 (ref 9–20)
BUN: 15 mg/dL (ref 6–24)
Bilirubin Total: 0.6 mg/dL (ref 0.0–1.2)
CO2: 22 mmol/L (ref 20–29)
Calcium: 10.1 mg/dL (ref 8.7–10.2)
Chloride: 93 mmol/L — ABNORMAL LOW (ref 96–106)
Creatinine, Ser: 0.86 mg/dL (ref 0.76–1.27)
Globulin, Total: 2.6 g/dL (ref 1.5–4.5)
Glucose: 269 mg/dL — ABNORMAL HIGH (ref 70–99)
Potassium: 4.3 mmol/L (ref 3.5–5.2)
Sodium: 134 mmol/L (ref 134–144)
Total Protein: 7.5 g/dL (ref 6.0–8.5)
eGFR: 104 mL/min/1.73 (ref >59)

## 2024-05-01 LAB — DIABETES AUTOIMMUNE PROFILE
Anti GAD 65 Antibodies: <5 U/mL
IA-2 Autoantibodies: <7.5 U/mL
Insulin AutoAb: <5 uU/mL
ZNT8 Antibodies: <15 U/mL

## 2024-05-01 LAB — C-PEPTIDE: C-Peptide: 3.4 ng/mL (ref 1.1–4.4)

## 2024-05-01 LAB — TSH: TSH: 1.3 u[IU]/mL (ref 0.450–4.500)

## 2024-05-01 NOTE — Telephone Encounter (Signed)
Printed and faxed  KP 

## 2024-05-02 ENCOUNTER — Ambulatory Visit: Admitting: Physician Assistant

## 2024-05-23 ENCOUNTER — Other Ambulatory Visit: Payer: Self-pay | Admitting: Physician Assistant

## 2024-05-23 NOTE — Telephone Encounter (Signed)
 Copied from CRM #[id number]. Topic: Clinical - Medication Refill >> May 23, 2024 10:09 AM Paige D wrote: Medication: metFORMIN (GLUCOPHAGE) 500 MG tablet  Has the patient contacted their pharmacy? No (Agent: If no, request that the patient contact the pharmacy for the refill. If patient does not wish to contact the pharmacy document the reason why and proceed with request.) (Agent: If yes, when and what did the pharmacy advise?)  This is the patient's preferred pharmacy:  [id number] - [place], [place] - [address] [place] [address] Phone: [phone] Fax: [phone]  Is this the correct pharmacy for this prescription? Yes If no, delete pharmacy and type the correct one.   Has the prescription been filled recently? No  Is the patient out of the medication? Yes  Has the patient been seen for an appointment in the last year OR does the patient have an upcoming appointment? Yes  Can we respond through MyChart? No, prefer phone call   Agent: Please be advised that Rx refills may take up to 3 business days. We ask that you follow-up with your pharmacy.

## 2024-05-26 NOTE — Telephone Encounter (Signed)
 Requested medication (s) are due for refill today: Yes  Requested medication (s) are on the active medication list: Yes  Last refill:  04/29/24  Future visit scheduled: Yes  Notes to clinic:  Unable to refill per protocol, last refill by another provide and failed labs, no updated CBC results.      Requested Prescriptions  Pending Prescriptions Disp Refills   metFORMIN  (GLUCOPHAGE ) 500 MG tablet      Sig: Take 1 tablet (500 mg total) by mouth 2 (two) times daily.     Endocrinology:  Diabetes - Biguanides Failed - 05/26/2024  2:47 PM      Failed - HBA1C is between 0 and 7.9 and within 180 days    Hemoglobin A1C  Date Value Ref Range Status  04/30/2024 9.6 (A) 4.0 - 5.6 % Final         Failed - B12 Level in normal range and within 720 days    No results found for: VITAMINB12       Failed - CBC within normal limits and completed in the last 12 months    WBC  Date Value Ref Range Status  05/21/2023 10.0 4.0 - 10.5 K/uL Final   RBC  Date Value Ref Range Status  05/21/2023 5.46 4.22 - 5.81 MIL/uL Final   Hemoglobin  Date Value Ref Range Status  05/21/2023 15.7 13.0 - 17.0 g/dL Final   HGB  Date Value Ref Range Status  03/22/2014 15.0 13.0 - 18.0 g/dL Final   HCT  Date Value Ref Range Status  05/21/2023 45.2 39.0 - 52.0 % Final  03/22/2014 44.5 40.0 - 52.0 % Final   MCHC  Date Value Ref Range Status  05/21/2023 34.7 30.0 - 36.0 g/dL Final   [facility]  Date Value Ref Range Status  05/21/2023 28.8 26.0 - 34.0 pg Final   MCV  Date Value Ref Range Status  05/21/2023 82.8 80.0 - 100.0 fL Final  03/22/2014 86 80 - 100 fL Final   No results found for: PLTCOUNTKUC, LABPLAT, POCPLA RDW  Date Value Ref Range Status  05/21/2023 12.2 11.5 - 15.5 % Final  03/22/2014 13.2 11.5 - 14.5 % Final         Passed - Cr in normal range and within 360 days    Creatinine  Date Value Ref Range Status  03/22/2014 1.18 0.60 - 1.30 mg/dL Final   Creatinine, Ser  Date Value  Ref Range Status  04/16/2024 0.86 0.76 - 1.27 mg/dL Final         Passed - eGFR in normal range and within 360 days    EGFR (African American)  Date Value Ref Range Status  03/22/2014 >60 >54mL/min Final   EGFR (Non-African Amer.)  Date Value Ref Range Status  03/22/2014 >60 >71mL/min Final    Comment:    eGFR values <84mL/min/1.73 m2 may be an indication of chronic kidney disease (CKD). Calculated eGFR, using the MRDR Study equation, is useful in  patients with stable renal function. The eGFR calculation will not be reliable in acutely ill patients when serum creatinine is changing rapidly. It is not useful in patients on dialysis. The eGFR calculation may not be applicable to patients at the low and high extremes of body sizes, pregnant women, and vegetarians.    GFR, Estimated  Date Value Ref Range Status  05/21/2023 >60 >60 mL/min Final    Comment:    (NOTE) Calculated using the CKD-EPI Creatinine Equation (2021)    eGFR  Date Value  Ref Range Status  04/16/2024 104 >59 mL/min/1.73 Final         Passed - Valid encounter within last 6 months    Recent Outpatient Visits           3 weeks ago Type 2 diabetes mellitus with hyperglycemia, without long-term current use of insulin  [facility])   [place] [facility] at [facility], [place]   1 month ago Hyperglycemia due to diabetes mellitus [facility])   [facility] at [facility], [place]io SQUIB[place], [place]

## 2024-05-27 MED ORDER — METFORMIN HCL 500 MG PO TABS: 500.0000 mg | ORAL_TABLET | Freq: Two times a day (BID) | ORAL | 2 refills | Status: AC

## 2024-05-28 ENCOUNTER — Encounter: Admitting: Physician Assistant

## 2024-05-29 ENCOUNTER — Encounter: Admitting: Dietician

## 2024-05-29 ENCOUNTER — Encounter: Payer: Self-pay | Admitting: Physician Assistant

## 2024-05-29 ENCOUNTER — Ambulatory Visit: Admitting: Physician Assistant

## 2024-05-29 VITALS — BP 136/96 | HR 80 | Temp 97.8°F | Ht 70.0 in | Wt 195.0 lb

## 2024-05-29 DIAGNOSIS — Z122 Encounter for screening for malignant neoplasm of respiratory organs: Secondary | ICD-10-CM

## 2024-05-29 DIAGNOSIS — Z0000 Encounter for general adult medical examination without abnormal findings: Secondary | ICD-10-CM | POA: Diagnosis not present

## 2024-05-29 DIAGNOSIS — Z23 Encounter for immunization: Secondary | ICD-10-CM | POA: Diagnosis not present

## 2024-05-29 DIAGNOSIS — E782 Mixed hyperlipidemia: Secondary | ICD-10-CM | POA: Diagnosis not present

## 2024-05-29 DIAGNOSIS — Z125 Encounter for screening for malignant neoplasm of prostate: Secondary | ICD-10-CM | POA: Diagnosis not present

## 2024-05-29 DIAGNOSIS — Z1211 Encounter for screening for malignant neoplasm of colon: Secondary | ICD-10-CM | POA: Diagnosis not present

## 2024-05-29 NOTE — Progress Notes (Signed)
 Date:  05/29/2024   Name:  Chris E Lagerstrom Jr.   DOB:  1970/12/21   MRN:  [id number]   Chief Complaint: Annual Exam  HPI  Chris Spence presents today for routine physical exam.  Last Physical: 1y ago Last Dental Exam: <1y ago but did not get a cleaning due to cost of deep clean Last Eye Exam: Mar 2025 [facility] Last CRC screen: never Last LDCT: 08/24/22 Last PSA: 03/06/22 Immunizations Due: Tdap, Prevnar 20, Shingrix    Medication list has been reviewed and updated.  Active Medications[1]   Review of Systems  Patient Active Problem List   Diagnosis Date Noted   Bilateral shoulder region arthritis 04/30/2024   Trigger middle finger of right hand 04/30/2024   Trigger index finger of left hand 04/30/2024   Tobacco use disorder 04/16/2024   Type 2 diabetes mellitus with hyperglycemia, without long-term current use of insulin  (HCC) 04/16/2024   Vasculogenic erectile dysfunction 04/16/2024   Mixed hyperlipidemia 04/16/2024   Multiple lung nodules 04/16/2024    Allergies[2]  Immunization History  Administered Date(s) Administered   PNEUMOCOCCAL CONJUGATE-20 05/29/2024   Tdap 05/29/2024    Past Surgical History:  Procedure Laterality Date   APPENDECTOMY     CERVICAL FUSION     C-6 and C-7   EYE SURGERY     R) eye- growth removed  L) eye- removed foreign object    Social History[3]  Family History  Problem Relation Age of Onset   Cancer Mother    Cancer Sister    Diabetes type II Maternal Grandmother    Diabetes type II Maternal Uncle         05/29/2024    8:08 AM 04/30/2024    8:16 AM 04/23/2023    9:21 AM 06/16/2022    8:20 AM  GAD 7 : Generalized Anxiety Score  Nervous, Anxious, on Edge 2 2 0 0  Control/stop worrying 2 2 0 0  Worry too much - different things 2 2 0 0  Trouble relaxing 2 2 3  0  Restless 0 0 0 0  Easily annoyed or irritable 1 1 2  0  Afraid - awful might happen 0 0 0 0  Total GAD 7 Score 9 9 5  0  Anxiety Difficulty Somewhat  difficult Very difficult Very difficult Not difficult at all       05/29/2024    8:08 AM 04/30/2024    8:16 AM 04/23/2023    9:21 AM  Depression screen PHQ 2/9  Decreased Interest 0 0 1  Down, Depressed, Hopeless 0 0 0  PHQ - 2 Score 0 0 1  Altered sleeping   3  Tired, decreased energy   3  Change in appetite   0  Feeling bad or failure about yourself    0  Trouble concentrating   0  Moving slowly or fidgety/restless   0  Suicidal thoughts   0  PHQ-9 Score   7   Difficult doing work/chores   Not difficult at all     Data saved with a previous flowsheet row definition    BP Readings from Last 3 Encounters:  05/29/24 (!) 136/96  04/30/24 138/84  04/16/24 138/80    Wt Readings from Last 3 Encounters:  05/29/24 195 lb (88.5 kg)  04/30/24 198 lb (89.8 kg)  04/16/24 199 lb (90.3 kg)    BP (!) 136/96 (Cuff Size: Large)   Pulse 80   Temp 97.8 F (36.6 C)   Ht 5'  10 (1.778 m)   Wt 195 lb (88.5 kg)   SpO2 98%   BMI 27.98 kg/m   Physical Exam Vitals and nursing note reviewed.  Constitutional:      Appearance: Normal appearance.  HENT:     Ears:     Comments: EAC clear bilaterally with good view of TM which is without effusion or erythema.     Nose: Nose normal.     Mouth/Throat:     Mouth: Mucous membranes are moist. No oral lesions.     Dentition: Normal dentition.     Pharynx: No posterior oropharyngeal erythema.  Eyes:     Extraocular Movements: Extraocular movements intact.     Conjunctiva/sclera: Conjunctivae normal.     Pupils: Pupils are equal, round, and reactive to light.  Neck:     Thyroid: No thyromegaly.  Cardiovascular:     Rate and Rhythm: Normal rate and regular rhythm.     Heart sounds: No murmur heard.    No friction rub. No gallop.     Comments: Pulses 2+ at radial, PT, DP bilaterally. No carotid bruit. No peripheral edema Pulmonary:     Effort: Pulmonary effort is normal.     Breath sounds: Normal breath sounds.  Abdominal:      General: Bowel sounds are normal.     Palpations: Abdomen is soft. There is no mass.     Tenderness: There is no abdominal tenderness.  Genitourinary:    Prostate: Normal. Not enlarged, not tender and no nodules present.     Rectum: Normal. Guaiac result negative. No mass.  Musculoskeletal:     Comments: Full ROM with strength 5/5 bilateral upper and lower extremities  Lymphadenopathy:     Cervical: No cervical adenopathy.  Skin:    General: Skin is warm.     Capillary Refill: Capillary refill takes less than 2 seconds.     Findings: No lesion or rash.  Neurological:     Mental Status: He is alert and oriented to person, place, and time.     Gait: Gait is intact.  Psychiatric:        Mood and Affect: Mood normal.        Behavior: Behavior normal.     Diabetic Foot Exam - Simple   Simple Foot Form Diabetic Foot exam was performed with the following findings: Yes 05/29/2024  8:46 AM  Visual Inspection See comments: Yes Sensation Testing Intact to touch and monofilament testing bilaterally: Yes Pulse Check Posterior Tibialis and Dorsalis pulse intact bilaterally: Yes Comments Mild superficial breakdown of skin at plantar surface without ulceration      Recent Labs     Component Value Date/Time   NA 134 04/16/2024 1503   NA 141 03/22/2014 1908   K 4.3 04/16/2024 1503   K 3.6 03/22/2014 1908   CL 93 (L) 04/16/2024 1503   CL 109 (H) 03/22/2014 1908   CO2 22 04/16/2024 1503   CO2 26 03/22/2014 1908   GLUCOSE 269 (H) 04/16/2024 1503   GLUCOSE 106 (H) 05/21/2023 0531   GLUCOSE 80 03/22/2014 1908   BUN 15 04/16/2024 1503   BUN 15 03/22/2014 1908   CREATININE 0.86 04/16/2024 1503   CREATININE 1.18 03/22/2014 1908   CALCIUM  10.1 04/16/2024 1503   CALCIUM  8.4 (L) 03/22/2014 1908   PROT 7.5 04/16/2024 1503   ALBUMIN 4.9 04/16/2024 1503   AST 18 04/16/2024 1503   ALT 32 04/16/2024 1503   ALKPHOS 137 (H) 04/16/2024 1503   BILITOT  0.6 04/16/2024 1503   GFRNONAA >60  05/21/2023 0531   GFRNONAA >60 03/22/2014 1908   GFRAA >60 03/22/2014 1908    Lab Results  Component Value Date   WBC 10.0 05/21/2023   HGB 15.7 05/21/2023   HCT 45.2 05/21/2023   MCV 82.8 05/21/2023   PLT 186 05/21/2023   Lab Results  Component Value Date   HGBA1C 9.6 (A) 04/30/2024   Lab Results  Component Value Date   CHOL 240 (H) 06/16/2022   HDL 32 (L) 06/16/2022   LDLCALC 167 (H) 06/16/2022   TRIG 204 (H) 06/16/2022   CHOLHDL 7.5 06/16/2022   Lab Results  Component Value Date   TSH 1.300 04/16/2024      Assessment and Plan:  1. Annual physical exam (Primary) Encouraged healthy lifestyle including regular physical activity and consumption of whole fruits and vegetables. Encouraged routine dental and eye exams.  - CBC with Differential/Platelet - Lipid panel  2. Mixed hyperlipidemia Check fasting lipids, currently on low-dose rosuvastatin  5 mg daily but may increase depending on his results. - Lipid panel  3. Screening for colon cancer Patient has never had a real CRC screening.  Given his smoking history and late initial screening, suggested colonoscopy, and he is willing to proceed with this. - Ambulatory referral to Gastroenterology  4. Screening PSA (prostate specific antigen) Prostate exam normal, check PSA - PSA  5. Screening for lung cancer Patient reminded he is due for repeat LDCT, I also reached out to [facility] pulmonology to schedule at his last one.  6. Encounter for immunization Updated Tdap and Prevnar 20 today.  Information given on Shingrix which he can do another time with our office or at his local pharmacy. - Tdap vaccine greater than or equal to 7yo IM - Pneumococcal conjugate vaccine 20-valent     Follow-up 2 months OV chronic conditions, DM2 Follow-up 1 year CPE   Rolan Hoyle, PA-C, DMSc, DipACLM, Nutritionist [facility] [phone]       [1]  Current Meds  Medication Sig   Continuous Glucose Sensor (DEXCOM G7 SENSOR) MISC Place 1 each onto the skin as directed. Change sensor every 10 days.   insulin  glargine (LANTUS  SOLOSTAR) 100 UNIT/ML Solostar Pen Inject 10 Units into the skin daily. Increase by 5 units each week until fasting blood sugar is consistently <200 mg/dL. Do not exceed 25 units daily unless you discuss with healthcare provider.   Insulin  Pen Needle 32G X 4 MM MISC 1 each by Does not apply route daily.   meloxicam  (MOBIC ) 15 MG tablet Take 1 tablet (15 mg total) by mouth daily.   metFORMIN  (GLUCOPHAGE ) 500 MG tablet Take 1 tablet (500 mg total) by mouth 2 (two) times daily.   Multiple Vitamins-Minerals (ONE-A-DAY MENS HEALTH FORMULA PO) Take 1 tablet by mouth daily.   OZEMPIC , 0.25 OR 0.5 MG/DOSE, 2 MG/1.5ML SOPN Inject 0.25 mg into the skin once a week.   rosuvastatin  (CRESTOR ) 5 MG tablet Take 1 tablet (5 mg total) by mouth daily.   sildenafil  (VIAGRA ) 100 MG tablet Take 1 tablet (100 mg total) by mouth daily as needed for erectile dysfunction.  [2]  Allergies Allergen Reactions   Tramadol Nausea And Vomiting  [3]  Social History Tobacco Use   Smoking status: Every Day    Current packs/day: 1.00    Average packs/day: 1 pack/day for 41.0 years (41.0 ttl pk-yrs)    Types: Cigarettes  Start date: 41   Smokeless tobacco: Never   Tobacco comments:    patches and pills discussed  Vaping Use   Vaping status: Never Used  Substance Use Topics   Alcohol use: Yes    Comment: occassionally   Drug use: No

## 2024-05-30 ENCOUNTER — Other Ambulatory Visit: Payer: Self-pay | Admitting: Physician Assistant

## 2024-05-30 ENCOUNTER — Ambulatory Visit: Payer: Self-pay | Admitting: Physician Assistant

## 2024-05-30 DIAGNOSIS — E1165 Type 2 diabetes mellitus with hyperglycemia: Secondary | ICD-10-CM

## 2024-05-30 LAB — LIPID PANEL
Chol/HDL Ratio: 4 ratio (ref 0.0–5.0)
Cholesterol, Total: 135 mg/dL (ref 100–199)
HDL: 34 mg/dL — ABNORMAL LOW
LDL Chol Calc (NIH): 77 mg/dL (ref 0–99)
Triglycerides: 132 mg/dL (ref 0–149)
VLDL Cholesterol Cal: 24 mg/dL (ref 5–40)

## 2024-05-30 LAB — CBC WITH DIFFERENTIAL/PLATELET
Basophils Absolute: 0 x10E3/uL (ref 0.0–0.2)
Basos: 0 %
EOS (ABSOLUTE): 0.2 x10E3/uL (ref 0.0–0.4)
Eos: 3 %
Hematocrit: 49.1 % (ref 37.5–51.0)
Hemoglobin: 16.1 g/dL (ref 13.0–17.7)
Immature Grans (Abs): 0 x10E3/uL (ref 0.0–0.1)
Immature Granulocytes: 0 %
Lymphocytes Absolute: 2 x10E3/uL (ref 0.7–3.1)
Lymphs: 30 %
MCH: 28.7 pg (ref 26.6–33.0)
MCHC: 32.8 g/dL (ref 31.5–35.7)
MCV: 88 fL (ref 79–97)
Monocytes Absolute: 0.5 x10E3/uL (ref 0.1–0.9)
Monocytes: 7 %
Neutrophils Absolute: 4 x10E3/uL (ref 1.4–7.0)
Neutrophils: 60 %
Platelets: 187 x10E3/uL (ref 150–450)
RBC: 5.61 x10E6/uL (ref 4.14–5.80)
RDW: 13.1 % (ref 11.6–15.4)
WBC: 6.7 x10E3/uL (ref 3.4–10.8)

## 2024-05-30 LAB — PSA: Prostate Specific Ag, Serum: 0.5 ng/mL (ref 0.0–4.0)

## 2024-05-30 MED ORDER — ROSUVASTATIN CALCIUM 5 MG PO TABS: 5.0000 mg | ORAL_TABLET | Freq: Every day | ORAL | 1 refills | Status: AC

## 2024-06-02 ENCOUNTER — Other Ambulatory Visit: Payer: Self-pay | Admitting: Physician Assistant

## 2024-06-02 DIAGNOSIS — F17210 Nicotine dependence, cigarettes, uncomplicated: Secondary | ICD-10-CM | POA: Diagnosis not present

## 2024-06-02 DIAGNOSIS — E1165 Type 2 diabetes mellitus with hyperglycemia: Secondary | ICD-10-CM | POA: Diagnosis not present

## 2024-06-02 DIAGNOSIS — Z1331 Encounter for screening for depression: Secondary | ICD-10-CM | POA: Diagnosis not present

## 2024-06-02 DIAGNOSIS — E782 Mixed hyperlipidemia: Secondary | ICD-10-CM | POA: Diagnosis not present

## 2024-06-02 DIAGNOSIS — M65331 Trigger finger, right middle finger: Secondary | ICD-10-CM

## 2024-06-02 DIAGNOSIS — M19011 Primary osteoarthritis, right shoulder: Secondary | ICD-10-CM

## 2024-06-02 DIAGNOSIS — M65322 Trigger finger, left index finger: Secondary | ICD-10-CM

## 2024-06-02 MED ORDER — MELOXICAM 15 MG PO TABS: 15.0000 mg | ORAL_TABLET | Freq: Every day | ORAL | 0 refills | Status: AC

## 2024-06-02 MED ORDER — OZEMPIC (0.25 OR 0.5 MG/DOSE) 2 MG/1.5ML ~~LOC~~ SOPN: 0.5000 mg | PEN_INJECTOR | SUBCUTANEOUS | 1 refills | Status: AC

## 2024-06-02 NOTE — Telephone Encounter (Signed)
 Please review.  KP

## 2024-06-02 NOTE — Telephone Encounter (Signed)
 Spoke with patient, refilled Ozempic  at the 0.5 mg dose, also refilled his meloxicam .  He was advised that if there are any issues at the pharmacy such as a large co-pay or supply problem, he may pick up a sample at the office for the next month.

## 2024-06-04 ENCOUNTER — Telehealth: Payer: Self-pay | Admitting: Pharmacy Technician

## 2024-06-04 ENCOUNTER — Other Ambulatory Visit (HOSPITAL_COMMUNITY): Payer: Self-pay

## 2024-06-04 ENCOUNTER — Telehealth: Payer: Self-pay

## 2024-06-04 NOTE — Telephone Encounter (Signed)
 Please complete PA for Ozempic .  KP

## 2024-06-04 NOTE — Telephone Encounter (Signed)
 Pharmacy Patient Advocate Encounter   Received notification from Pt Calls Messages that prior authorization for Ozempic  (0.25 or 0.5 MG/DOSE) 2MG /3ML pen-injectors is required/requested.   Insurance verification completed.   The patient is insured through [facility]   Per test claim: PA required; PA started via CoverMyMeds. KEY [id number] . Waiting for clinical questions to populate.

## 2024-06-04 NOTE — Telephone Encounter (Signed)
 PA request has been Submitted. New Encounter has been or will be created for follow up. For additional info see Pharmacy Prior Auth telephone encounter from 06/04/24.

## 2024-06-04 NOTE — Telephone Encounter (Signed)
 Pt came in the office for a sample. Started the process of PA. 0.25-0.5  KP

## 2024-06-04 NOTE — Telephone Encounter (Signed)
 Pharmacy Patient Advocate Encounter  Received notification from [employer] that Prior Authorization for Ozempic  (0.25 or 0.5 MG/DOSE) 2MG /3ML pen-injectors has been APPROVED from 06/04/24 to 06/04/25   PA #/Case ID/Reference #: [id number]

## 2024-06-10 NOTE — Telephone Encounter (Signed)
 Received notification from [facility] that Prior Authorization for Ozempic  (0.25 or 0.5 MG/DOSE) 2MG /3ML pen-injectors has been APPROVED from 06/04/24 to 06/04/25

## 2024-06-16 ENCOUNTER — Encounter: Payer: Self-pay | Admitting: *Deleted

## 2024-06-16 ENCOUNTER — Telehealth: Payer: Self-pay

## 2024-06-16 ENCOUNTER — Other Ambulatory Visit: Payer: Self-pay | Admitting: *Deleted

## 2024-06-16 ENCOUNTER — Telehealth: Payer: Self-pay | Admitting: *Deleted

## 2024-06-16 DIAGNOSIS — F17210 Nicotine dependence, cigarettes, uncomplicated: Secondary | ICD-10-CM

## 2024-06-16 DIAGNOSIS — Z1211 Encounter for screening for malignant neoplasm of colon: Secondary | ICD-10-CM

## 2024-06-16 DIAGNOSIS — Z87891 Personal history of nicotine dependence: Secondary | ICD-10-CM

## 2024-06-16 MED ORDER — NA SULFATE-K SULFATE-MG SULF 17.5-3.13-1.6 GM/177ML PO SOLN: 1.0000 | Freq: Once | ORAL | 0 refills | Status: AC

## 2024-06-16 NOTE — Telephone Encounter (Signed)
 Gastroenterology Pre-Procedure Review  Request Date: 09/18/24 Requesting Physician: Dr. Jinny  PATIENT REVIEW QUESTIONS: The patient responded to the following health history questions as indicated:    1. Are you having any GI issues? no 2. Do you have a personal history of Polyps? no 3. Do you have a family history of Colon Cancer or Polyps? no 4. Diabetes Mellitus? no 5. Joint replacements in the past 12 months?no 6. Major health problems in the past 3 months?no 7. Any artificial heart valves, MVP, or defibrillator?no    MEDICATIONS & ALLERGIES:    Patient reports the following regarding taking any anticoagulation/antiplatelet therapy:   Plavix, Coumadin, Eliquis, Xarelto, Lovenox, Pradaxa, Brilinta, or Effient? no Aspirin? no  Patient confirms/reports the following medications:  Current Outpatient Medications  Medication Sig Dispense Refill   Continuous Glucose Sensor (DEXCOM G7 SENSOR) MISC Place 1 each onto the skin as directed. Change sensor every 10 days. 3 each 5   insulin  glargine (LANTUS  SOLOSTAR) 100 UNIT/ML Solostar Pen Inject 10 Units into the skin daily. Increase by 5 units each week until fasting blood sugar is consistently <200 mg/dL. Do not exceed 25 units daily unless you discuss with healthcare provider. 15 mL PRN   Insulin  Pen Needle 32G X 4 MM MISC 1 each by Does not apply route daily. 50 each 1   meloxicam  (MOBIC ) 15 MG tablet Take 1 tablet (15 mg total) by mouth daily. 90 tablet 0   metFORMIN  (GLUCOPHAGE ) 500 MG tablet Take 1 tablet (500 mg total) by mouth 2 (two) times daily. 60 tablet 2   Multiple Vitamins-Minerals (ONE-A-DAY MENS HEALTH FORMULA PO) Take 1 tablet by mouth daily.     OZEMPIC , 0.25 OR 0.5 MG/DOSE, 2 MG/1.5ML SOPN Inject 0.5 mg into the skin once a week. 1.5 mL 1   rosuvastatin  (CRESTOR ) 5 MG tablet Take 1 tablet (5 mg total) by mouth daily. 90 tablet 1   sildenafil  (VIAGRA ) 100 MG tablet Take 1 tablet (100 mg total) by mouth daily as needed for  erectile dysfunction. 10 tablet 11   No current facility-administered medications for this visit.    Patient confirms/reports the following allergies:  Allergies[1]  No orders of the defined types were placed in this encounter.   AUTHORIZATION INFORMATION Primary Insurance: 1D#: Group #:  Secondary Insurance: 1D#: Group #:  SCHEDULE INFORMATION: Date: 09/18/24 Time: Location: [facility]     [1]  Allergies Allergen Reactions   Tramadol Nausea And Vomiting

## 2024-06-16 NOTE — Progress Notes (Signed)
 Gastroenterology Pre-Procedure Review  Request Date: 09/18/24 Requesting Physician: Dr. Jinny  PATIENT REVIEW QUESTIONS: The patient responded to the following health history questions as indicated:    1. Are you having any GI issues? no 2. Do you have a personal history of Polyps? no 3. Do you have a family history of Colon Cancer or Polyps? no 4. Diabetes Mellitus? Yes has been advised to stop ozempic  7 days prior and stop metformin  2 days prior 5. Joint replacements in the past 12 months?no 6. Major health problems in the past 3 months?no 7. Any artificial heart valves, MVP, or defibrillator?no    MEDICATIONS & ALLERGIES:    Patient reports the following regarding taking any anticoagulation/antiplatelet therapy:   Plavix, Coumadin, Eliquis, Xarelto, Lovenox, Pradaxa, Brilinta, or Effient? no Aspirin? no  Patient confirms/reports the following medications:  Current Outpatient Medications  Medication Sig Dispense Refill   Continuous Glucose Sensor (DEXCOM G7 SENSOR) MISC Place 1 each onto the skin as directed. Change sensor every 10 days. 3 each 5   insulin  glargine (LANTUS  SOLOSTAR) 100 UNIT/ML Solostar Pen Inject 10 Units into the skin daily. Increase by 5 units each week until fasting blood sugar is consistently <200 mg/dL. Do not exceed 25 units daily unless you discuss with healthcare provider. 15 mL PRN   Insulin  Pen Needle 32G X 4 MM MISC 1 each by Does not apply route daily. 50 each 1   meloxicam  (MOBIC ) 15 MG tablet Take 1 tablet (15 mg total) by mouth daily. 90 tablet 0   metFORMIN  (GLUCOPHAGE ) 500 MG tablet Take 1 tablet (500 mg total) by mouth 2 (two) times daily. 60 tablet 2   Multiple Vitamins-Minerals (ONE-A-DAY MENS HEALTH FORMULA PO) Take 1 tablet by mouth daily.     OZEMPIC , 0.25 OR 0.5 MG/DOSE, 2 MG/1.5ML SOPN Inject 0.5 mg into the skin once a week. 1.5 mL 1   rosuvastatin  (CRESTOR ) 5 MG tablet Take 1 tablet (5 mg total) by mouth daily. 90 tablet 1   sildenafil   (VIAGRA ) 100 MG tablet Take 1 tablet (100 mg total) by mouth daily as needed for erectile dysfunction. 10 tablet 11   No current facility-administered medications for this visit.    Patient confirms/reports the following allergies:  Allergies[1]  No orders of the defined types were placed in this encounter.   AUTHORIZATION INFORMATION Primary Insurance: 1D#: Group #:  Secondary Insurance: 1D#: Group #:  SCHEDULE INFORMATION: Date:  Time: Location:     [1]  Allergies Allergen Reactions   Tramadol Nausea And Vomiting

## 2024-06-16 NOTE — Telephone Encounter (Signed)
 2nd Encounter opened in error.  Thanks,  [place], CMA

## 2024-06-16 NOTE — Telephone Encounter (Signed)
 Left VM for pt to call to schedule yearly lung screening CT.

## 2024-06-27 NOTE — Progress Notes (Signed)
 Chris Spence.                                          MRN: [id number]   06/27/2024   The [facility] Quality Team Specialist reviewed this patient medical record for the purposes of chart review for care gap closure. The following were reviewed: chart review for care gap closure-glycemic status assessment.    [facility] Quality Team

## 2024-06-30 ENCOUNTER — Encounter: Admitting: Dietician

## 2024-07-31 ENCOUNTER — Ambulatory Visit: Admitting: Physician Assistant

## 2024-09-18 ENCOUNTER — Ambulatory Visit: Admit: 2024-09-18 | Admitting: Gastroenterology

## 2024-09-18 SURGERY — COLONOSCOPY
Anesthesia: General

## 2025-06-01 ENCOUNTER — Encounter: Admitting: Physician Assistant
